# Patient Record
Sex: Female | Born: 1985 | Race: White | Hispanic: No | Marital: Married | Smoking: Never smoker
Health system: Southern US, Community
[De-identification: ages and names within clinical notes are randomized; demographics above are authoritative.]

## PROBLEM LIST (undated history)

## (undated) ENCOUNTER — Inpatient Hospital Stay (HOSPITAL_COMMUNITY): Payer: Self-pay

## (undated) DIAGNOSIS — F419 Anxiety disorder, unspecified: Secondary | ICD-10-CM

## (undated) DIAGNOSIS — Z9889 Other specified postprocedural states: Secondary | ICD-10-CM

## (undated) DIAGNOSIS — R112 Nausea with vomiting, unspecified: Secondary | ICD-10-CM

## (undated) DIAGNOSIS — R9431 Abnormal electrocardiogram [ECG] [EKG]: Secondary | ICD-10-CM

## (undated) DIAGNOSIS — F341 Dysthymic disorder: Secondary | ICD-10-CM

## (undated) HISTORY — DX: Dysthymic disorder: F34.1

## (undated) HISTORY — PX: FRACTURE SURGERY: SHX138

## (undated) HISTORY — PX: WISDOM TOOTH EXTRACTION: SHX21

## (undated) HISTORY — DX: Anxiety disorder, unspecified: F41.9

## (undated) HISTORY — PX: NASAL FRACTURE SURGERY: SHX718

## (undated) HISTORY — DX: Abnormal electrocardiogram (ECG) (EKG): R94.31

---
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
## 2006-09-20 ENCOUNTER — Other Ambulatory Visit: Admission: RE | Admit: 2006-09-20 | Discharge: 2006-09-20 | Payer: Self-pay | Admitting: Family Medicine

## 2007-12-07 ENCOUNTER — Other Ambulatory Visit: Admission: RE | Admit: 2007-12-07 | Discharge: 2007-12-07 | Payer: Self-pay | Admitting: Family Medicine

## 2009-03-25 ENCOUNTER — Other Ambulatory Visit: Admission: RE | Admit: 2009-03-25 | Discharge: 2009-03-25 | Payer: Self-pay | Admitting: Family Medicine

## 2011-02-03 ENCOUNTER — Other Ambulatory Visit (HOSPITAL_COMMUNITY)
Admission: RE | Admit: 2011-02-03 | Discharge: 2011-02-03 | Disposition: A | Discharge status: Home / Self Care | Payer: BC Managed Care – PPO | Source: Ambulatory Visit | Attending: Family Medicine | Admitting: Family Medicine

## 2011-02-03 ENCOUNTER — Other Ambulatory Visit: Payer: Self-pay | Admitting: Family Medicine

## 2011-02-03 DIAGNOSIS — Z113 Encounter for screening for infections with a predominantly sexual mode of transmission: Secondary | ICD-10-CM | POA: Insufficient documentation

## 2011-02-03 DIAGNOSIS — Z124 Encounter for screening for malignant neoplasm of cervix: Secondary | ICD-10-CM | POA: Insufficient documentation

## 2012-02-02 ENCOUNTER — Ambulatory Visit (INDEPENDENT_AMBULATORY_CARE_PROVIDER_SITE_OTHER): Payer: BC Managed Care – PPO | Admitting: Physician Assistant

## 2012-02-02 VITALS — BP 124/73 | HR 66 | Temp 98.5°F | Resp 18 | Ht 60.0 in | Wt 129.0 lb

## 2012-02-02 DIAGNOSIS — J351 Hypertrophy of tonsils: Secondary | ICD-10-CM

## 2012-02-02 DIAGNOSIS — M25532 Pain in left wrist: Secondary | ICD-10-CM

## 2012-02-02 DIAGNOSIS — M25539 Pain in unspecified wrist: Secondary | ICD-10-CM

## 2012-02-02 MED ORDER — MELOXICAM 15 MG PO TABS: 15.0000 mg | ORAL_TABLET | Freq: Every day | ORAL | Status: AC

## 2012-02-02 NOTE — Patient Instructions (Signed)
If you have not heard about your referral appointments in 7 to 10 days please contact us.

## 2012-02-02 NOTE — Progress Notes (Signed)
  Subjective:    Patient ID: Stephanie Gomez, female    DOB: 1986/01/23, 26 y.o.   MRN: [id number]  HPI Ms. [name] is a 26 y/o female who presents with a cc of left wrist pain and right tonsillar swelling.    Left wrist pain began approximately 2 mos ago. No know mechanism of injury. Pain began as intermittent, sharp, shooting pain up the left pinky and ring finger.  It is now a dull, constant ache with intermittent shooting pains that are exacerbated by gripping objects and also  turning the wrist. Using a brace and tylenol arthritis seems to help. 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 She denies any history of injury to the neck and has no neck pain.  She denies tingling or numbness in the hand. She denies weakness.  Right tonsil is chronically swollen.  Patient states that she has a history of intermittent strep pharyngitis/tonsilitis, but denies current sxs. She has mild congestion but denies history of allergies or asthma.   She denies fever, nausea, vomiting, or chills. She states that she feels the tonsil every time she swallows which is bothersome to her.  She feels that she has to eat smaller bites of food and has some dysphagia due to the tonsil.   Review of Systems As stated in HPI    Objective:   Physical Exam  Constitutional: She appears well-developed and well-nourished.  Neck: Normal range of motion.       Right tonsil is enlarged.  No tonsilar or pharyngeal erythema. No exudates.  Cobblestone appearance on posterior pharynx consistent with post nasal drip.  Cardiovascular: Normal rate, regular rhythm and normal heart sounds.   Pulmonary/Chest: Effort normal and breath sounds normal. No respiratory distress.  Musculoskeletal:       Left wrist: She exhibits bony tenderness (over the medial carpals). She exhibits normal range of motion, no tenderness, no swelling, no effusion, no crepitus, no deformity and no laceration.       Pain with finger abduction  and pronation/supination of the wrist. Not tender along ulnar nerve at elbow. No weakness or sensory deficit. Phalens, tinnels, and axial load negative   Filed Vitals:   02/02/12 1616  BP: 124/73  Pulse: 66  Temp: 98.5 F (36.9 C)  Resp: 18          Assessment & Plan:   1. Wrist pain, left  Ambulatory referral to Sports Medicine, meloxicam (MOBIC) 15 MG tablet, continue with wrist brace  2. Swelling of tonsil  Ambulatory referral to ENT   See patient instructions

## 2015-07-07 LAB — OB RESULTS CONSOLE HIV ANTIBODY (ROUTINE TESTING): HIV: NONREACTIVE

## 2015-07-07 LAB — OB RESULTS CONSOLE RUBELLA ANTIBODY, IGM: Rubella: IMMUNE

## 2015-07-07 LAB — OB RESULTS CONSOLE ABO/RH: RH TYPE: POSITIVE

## 2015-07-07 LAB — OB RESULTS CONSOLE GC/CHLAMYDIA
Chlamydia: NEGATIVE
Gonorrhea: NEGATIVE

## 2015-07-07 LAB — OB RESULTS CONSOLE HEPATITIS B SURFACE ANTIGEN: Hepatitis B Surface Ag: NEGATIVE

## 2015-07-07 LAB — OB RESULTS CONSOLE ANTIBODY SCREEN: Antibody Screen: NEGATIVE

## 2015-10-12 NOTE — L&D Delivery Note (Signed)
Delivery Note At 2:59 PM a viable female was delivered via Vaginal, Spontaneous Delivery (Presentation: Left Occiput Anterior).  APGAR: 6, 9; weight  .   Placenta status: Intact, Spontaneous.  Cord: 3 vessels with the following complications: None.  Cord pH: not sent  Anesthesia: Epidural  Episiotomy: None Lacerations: Periurethral Suture Repair: 2.0 chromic Est. Blood Loss (mL): 350  Mom to postpartum.  Baby to Couplet care / Skin to Skin.  Stephanie Gomez A 02/26/2016, 3:16 PM

## 2015-12-15 DIAGNOSIS — F419 Anxiety disorder, unspecified: Secondary | ICD-10-CM | POA: Diagnosis not present

## 2016-02-12 LAB — OB RESULTS CONSOLE GBS: GBS: NEGATIVE

## 2016-02-26 ENCOUNTER — Inpatient Hospital Stay (HOSPITAL_COMMUNITY)
Admission: AD | Admit: 2016-02-26 | Discharge: 2016-02-28 | DRG: 775 | Disposition: A | Discharge status: Home / Self Care | Payer: Managed Care, Other (non HMO) | Source: Ambulatory Visit | Attending: Obstetrics & Gynecology | Admitting: Obstetrics & Gynecology

## 2016-02-26 ENCOUNTER — Inpatient Hospital Stay (HOSPITAL_COMMUNITY): Payer: Managed Care, Other (non HMO) | Admitting: Anesthesiology

## 2016-02-26 ENCOUNTER — Encounter (HOSPITAL_COMMUNITY): Payer: Self-pay

## 2016-02-26 DIAGNOSIS — F419 Anxiety disorder, unspecified: Secondary | ICD-10-CM | POA: Diagnosis not present

## 2016-02-26 DIAGNOSIS — G5603 Carpal tunnel syndrome, bilateral upper limbs: Secondary | ICD-10-CM | POA: Diagnosis present

## 2016-02-26 DIAGNOSIS — Z3A38 38 weeks gestation of pregnancy: Secondary | ICD-10-CM | POA: Diagnosis not present

## 2016-02-26 DIAGNOSIS — O9902 Anemia complicating childbirth: Secondary | ICD-10-CM | POA: Diagnosis present

## 2016-02-26 DIAGNOSIS — O4290 Premature rupture of membranes, unspecified as to length of time between rupture and onset of labor, unspecified weeks of gestation: Secondary | ICD-10-CM | POA: Diagnosis present

## 2016-02-26 DIAGNOSIS — Z6830 Body mass index (BMI) 30.0-30.9, adult: Secondary | ICD-10-CM | POA: Diagnosis not present

## 2016-02-26 DIAGNOSIS — E669 Obesity, unspecified: Secondary | ICD-10-CM | POA: Diagnosis present

## 2016-02-26 DIAGNOSIS — O4202 Full-term premature rupture of membranes, onset of labor within 24 hours of rupture: Principal | ICD-10-CM | POA: Diagnosis present

## 2016-02-26 DIAGNOSIS — Z880 Allergy status to penicillin: Secondary | ICD-10-CM | POA: Diagnosis not present

## 2016-02-26 DIAGNOSIS — O1204 Gestational edema, complicating childbirth: Secondary | ICD-10-CM | POA: Diagnosis present

## 2016-02-26 DIAGNOSIS — E66811 Obesity, class 1: Secondary | ICD-10-CM | POA: Diagnosis present

## 2016-02-26 HISTORY — DX: Nausea with vomiting, unspecified: R11.2

## 2016-02-26 HISTORY — DX: Nausea with vomiting, unspecified: Z98.890

## 2016-02-26 HISTORY — DX: Other specified postprocedural states: Z98.890

## 2016-02-26 LAB — CBC
HCT: 39 % (ref 36.0–46.0)
Hemoglobin: 13.2 g/dL (ref 12.0–15.0)
MCH: 31.4 pg (ref 26.0–34.0)
MCHC: 33.8 g/dL (ref 30.0–36.0)
MCV: 92.9 fL (ref 78.0–100.0)
PLATELETS: 217 10*3/uL (ref 150–400)
RBC: 4.2 MIL/uL (ref 3.87–5.11)
RDW: 14.4 % (ref 11.5–15.5)
WBC: 10.6 10*3/uL — AB (ref 4.0–10.5)

## 2016-02-26 LAB — TYPE AND SCREEN
ABO/RH(D): O POS
ANTIBODY SCREEN: NEGATIVE

## 2016-02-26 LAB — ABO/RH: ABO/RH(D): O POS

## 2016-02-26 MED ORDER — ACETAMINOPHEN 325 MG PO TABS: 650.0000 mg | ORAL_TABLET | ORAL | Status: DC | PRN

## 2016-02-26 MED ORDER — OXYCODONE-ACETAMINOPHEN 5-325 MG PO TABS: 2.0000 | ORAL_TABLET | ORAL | Status: DC | PRN

## 2016-02-26 MED ORDER — LIDOCAINE HCL (PF) 1 % IJ SOLN
INTRAMUSCULAR | Status: DC | PRN
  Administered 2016-02-26 (×2): 4 mL

## 2016-02-26 MED ORDER — DIPHENHYDRAMINE HCL 25 MG PO CAPS: 25.0000 mg | ORAL_CAPSULE | Freq: Four times a day (QID) | ORAL | Status: DC | PRN

## 2016-02-26 MED ORDER — EPHEDRINE 5 MG/ML INJ: 10.0000 mg | INTRAVENOUS | Status: DC | PRN

## 2016-02-26 MED ORDER — LIDOCAINE HCL (PF) 1 % IJ SOLN
30.0000 mL | INTRAMUSCULAR | Status: AC | PRN
  Administered 2016-02-26: 30 mL via SUBCUTANEOUS
  Filled 2016-02-26: qty 30

## 2016-02-26 MED ORDER — OXYTOCIN BOLUS FROM INFUSION
500.0000 mL | INTRAVENOUS | Status: DC
  Administered 2016-02-26: 500 mL via INTRAVENOUS

## 2016-02-26 MED ORDER — PRENATAL MULTIVITAMIN CH
1.0000 | ORAL_TABLET | Freq: Every day | ORAL | Status: DC
  Administered 2016-02-27 – 2016-02-28 (×2): 1 via ORAL
  Filled 2016-02-26 (×2): qty 1

## 2016-02-26 MED ORDER — BENZOCAINE-MENTHOL 20-0.5 % EX AERO
1.0000 "application " | INHALATION_SPRAY | CUTANEOUS | Status: DC | PRN
  Filled 2016-02-26: qty 56

## 2016-02-26 MED ORDER — SENNOSIDES-DOCUSATE SODIUM 8.6-50 MG PO TABS
2.0000 | ORAL_TABLET | ORAL | Status: DC
  Administered 2016-02-26 – 2016-02-27 (×2): 2 via ORAL
  Filled 2016-02-26 (×2): qty 2

## 2016-02-26 MED ORDER — ZOLPIDEM TARTRATE 5 MG PO TABS: 5.0000 mg | ORAL_TABLET | Freq: Every evening | ORAL | Status: DC | PRN

## 2016-02-26 MED ORDER — NALBUPHINE HCL 10 MG/ML IJ SOLN: 5.0000 mg | INTRAMUSCULAR | Status: DC | PRN

## 2016-02-26 MED ORDER — ONDANSETRON HCL 4 MG/2ML IJ SOLN: 4.0000 mg | INTRAMUSCULAR | Status: DC | PRN

## 2016-02-26 MED ORDER — DIPHENHYDRAMINE HCL 50 MG/ML IJ SOLN: 12.5000 mg | INTRAMUSCULAR | Status: DC | PRN

## 2016-02-26 MED ORDER — OXYCODONE-ACETAMINOPHEN 5-325 MG PO TABS: 1.0000 | ORAL_TABLET | ORAL | Status: DC | PRN

## 2016-02-26 MED ORDER — DIBUCAINE 1 % RE OINT: 1.0000 "application " | TOPICAL_OINTMENT | RECTAL | Status: DC | PRN

## 2016-02-26 MED ORDER — LACTATED RINGERS IV SOLN
INTRAVENOUS | Status: DC
  Administered 2016-02-26 (×2): via INTRAVENOUS

## 2016-02-26 MED ORDER — FENTANYL 2.5 MCG/ML BUPIVACAINE 1/10 % EPIDURAL INFUSION (WH - ANES)
14.0000 mL/h | INTRAMUSCULAR | Status: DC | PRN
  Administered 2016-02-26: 12 mL/h via EPIDURAL
  Filled 2016-02-26: qty 125

## 2016-02-26 MED ORDER — CITRIC ACID-SODIUM CITRATE 334-500 MG/5ML PO SOLN: 30.0000 mL | ORAL | Status: DC | PRN

## 2016-02-26 MED ORDER — MEASLES, MUMPS & RUBELLA VAC ~~LOC~~ INJ: 0.5000 mL | INJECTION | Freq: Once | SUBCUTANEOUS | Status: DC

## 2016-02-26 MED ORDER — OXYTOCIN 40 UNITS IN LACTATED RINGERS INFUSION - SIMPLE MED
2.5000 [IU]/h | INTRAVENOUS | Status: DC
  Filled 2016-02-26: qty 1000

## 2016-02-26 MED ORDER — COCONUT OIL OIL: 1.0000 "application " | TOPICAL_OIL | Status: DC | PRN

## 2016-02-26 MED ORDER — ONDANSETRON HCL 4 MG PO TABS: 4.0000 mg | ORAL_TABLET | ORAL | Status: DC | PRN

## 2016-02-26 MED ORDER — WITCH HAZEL-GLYCERIN EX PADS: 1.0000 "application " | MEDICATED_PAD | CUTANEOUS | Status: DC | PRN

## 2016-02-26 MED ORDER — FLEET ENEMA 7-19 GM/118ML RE ENEM: 1.0000 | ENEMA | RECTAL | Status: DC | PRN

## 2016-02-26 MED ORDER — TETANUS-DIPHTH-ACELL PERTUSSIS 5-2.5-18.5 LF-MCG/0.5 IM SUSP: 0.5000 mL | Freq: Once | INTRAMUSCULAR | Status: DC

## 2016-02-26 MED ORDER — PHENYLEPHRINE 40 MCG/ML (10ML) SYRINGE FOR IV PUSH (FOR BLOOD PRESSURE SUPPORT): 80.0000 ug | PREFILLED_SYRINGE | INTRAVENOUS | Status: DC | PRN

## 2016-02-26 MED ORDER — SIMETHICONE 80 MG PO CHEW: 80.0000 mg | CHEWABLE_TABLET | ORAL | Status: DC | PRN

## 2016-02-26 MED ORDER — LACTATED RINGERS IV SOLN: 500.0000 mL | INTRAVENOUS | Status: DC | PRN

## 2016-02-26 MED ORDER — ONDANSETRON HCL 4 MG/2ML IJ SOLN: 4.0000 mg | Freq: Four times a day (QID) | INTRAMUSCULAR | Status: DC | PRN

## 2016-02-26 MED ORDER — IBUPROFEN 600 MG PO TABS
600.0000 mg | ORAL_TABLET | Freq: Four times a day (QID) | ORAL | Status: DC
  Administered 2016-02-26 – 2016-02-28 (×8): 600 mg via ORAL
  Filled 2016-02-26 (×8): qty 1

## 2016-02-26 MED ORDER — LACTATED RINGERS IV SOLN
500.0000 mL | Freq: Once | INTRAVENOUS | Status: AC
  Administered 2016-02-26: 500 mL via INTRAVENOUS

## 2016-02-26 MED ORDER — PHENYLEPHRINE 40 MCG/ML (10ML) SYRINGE FOR IV PUSH (FOR BLOOD PRESSURE SUPPORT)
80.0000 ug | PREFILLED_SYRINGE | INTRAVENOUS | Status: DC | PRN
  Filled 2016-02-26: qty 10

## 2016-02-26 NOTE — Lactation Note (Signed)
This note was copied from a baby's chart. Lactation Consultation Note  Patient Name: Stephanie Gomez LKGMW'NToday's Date: 02/26/2016 Reason for consult: Initial assessment Baby at 6 hr of life. Mom has short shaft nipples with compressible breast, but her hands and feet are "very swollen". Baby had tongue curled up and might need an oral assessment if latch does not improve. Baby will open wide and as long as mom holds the breast baby will stay on for 10-12 sucks. Without support baby takes 2-6 sucks and slips off the nipple. Discussed baby behavior, feeding frequency, baby belly size, voids, wt loss, breast changes, and nipple care. Demonstrated manual expression, colostrum noted bilaterally, spoon in room. Given lactation handouts. Aware of OP services and support group.     Maternal Data Has patient been taught Hand Expression?: Yes Does the patient have breastfeeding experience prior to this delivery?: No  Feeding Feeding Type: Breast Fed  LATCH Score/Interventions Latch: Repeated attempts needed to sustain latch, nipple held in mouth throughout feeding, stimulation needed to elicit sucking reflex. Intervention(s): Adjust position;Assist with latch;Breast compression  Audible Swallowing: None Intervention(s): Skin to skin;Hand expression  Type of Nipple: Everted at rest and after stimulation  Comfort (Breast/Nipple): Soft / non-tender     Hold (Positioning): Full assist, staff holds infant at breast Intervention(s): Support Pillows;Position options  LATCH Score: 5  Lactation Tools Discussed/Used WIC Program: No   Consult Status Consult Status: Follow-up Date: 02/27/16 Follow-up type: In-patient    Stephanie Gomez 02/26/2016, 9:36 PM

## 2016-02-26 NOTE — Anesthesia Procedure Notes (Signed)
Epidural Patient location during procedure: OB  Staffing Anesthesiologist: Tonna Palazzi Performed by: anesthesiologist   Preanesthetic Checklist Completed: patient identified, site marked, surgical consent, pre-op evaluation, timeout performed, IV checked, risks and benefits discussed and monitors and equipment checked  Epidural Patient position: sitting Prep: site prepped and draped and DuraPrep Patient monitoring: continuous pulse ox and blood pressure Approach: midline Location: L3-L4 Injection technique: LOR saline  Needle:  Needle type: Tuohy  Needle gauge: 17 G Needle length: 9 cm and 9 Needle insertion depth: 6 cm Catheter type: closed end flexible Catheter size: 19 Gauge Catheter at skin depth: 10 cm Test dose: negative  Assessment Events: blood not aspirated, injection not painful, no injection resistance, negative IV test and no paresthesia  Additional Notes Patient identified. Risks/Benefits/Options discussed with patient including but not limited to bleeding, infection, nerve damage, paralysis, failed block, incomplete pain control, headache, blood pressure changes, nausea, vomiting, reactions to medication both or allergic, itching and postpartum back pain. Confirmed with bedside nurse the patient's most recent platelet count. Confirmed with patient that they are not currently taking any anticoagulation, have any bleeding history or any family history of bleeding disorders. Patient expressed understanding and wished to proceed. All questions were answered. Sterile technique was used throughout the entire procedure. Please see nursing notes for vital signs. Test dose was given through epidural catheter and negative prior to continuing to dose epidural or start infusion. Warning signs of high block given to the patient including shortness of breath, tingling/numbness in hands, complete motor block, or any concerning symptoms with instructions to call for help. Patient was  given instructions on fall risk and not to get out of bed. All questions and concerns addressed with instructions to call with any issues or inadequate analgesia.    

## 2016-02-26 NOTE — Anesthesia Pain Management Evaluation Note (Signed)
  CRNA Pain Management Visit Note  Patient: Stephanie PertRebecca N Esperanza, 30 y.o., female  "Hello I am a [facility] the anesthesia [facility]. We have an anesthesia team available at all times to provide care throughout the hospital, including epidural management and anesthesia for C-section. I don't know your plan for the delivery whether it a natural birth, water birth, IV sedation, nitrous supplementation, doula or epidural, but we want to meet your pain goals."   1.Was your pain managed to your expectations on prior hospitalizations?   No prior hospitalizations  2.What is your expectation for pain management during this hospitalization?     Epidural  3.How can we help you reach that goal? **epidural pending  Record the patient's initial score and the patient's pain goal.   Pain: 9  Pain Goal: 8 [facility] wants you to be able to say your pain was always managed very well.  Edison PaceWILKERSON,Kaycee Haycraft 02/26/2016

## 2016-02-26 NOTE — Progress Notes (Signed)
Patient ID: Stephanie PertRebecca N Crago, female   DOB: May 26, 1986, 30 y.o.   MRN: [id number] Pt comfortable with epidural BP 110/66 mmHg  Pulse 118  Temp(Src) 98.4 F (36.9 C) (Oral)  Resp 18  Ht 4' 11.5" (1.511 m)  Wt 172 lb (78.019 kg)  BMI 34.17 kg/m2  SpO2 98%  LMP 05/31/2015 Category 1 9/100/0 Continue to labor Anticipate SVD

## 2016-02-26 NOTE — Anesthesia Preprocedure Evaluation (Signed)
Anesthesia Evaluation  Patient identified by MRN, date of birth, ID band Patient awake    Reviewed: Allergy & Precautions, NPO status , Patient's Chart, lab work & pertinent test results  History of Anesthesia Complications (+) PONV and history of anesthetic complications  Airway Mallampati: II  TM Distance: >3 FB Neck ROM: Full    Dental no notable dental hx. (+) Dental Advisory Given   Pulmonary neg pulmonary ROS,    Pulmonary exam normal breath sounds clear to auscultation       Cardiovascular negative cardio ROS Normal cardiovascular exam Rhythm:Regular Rate:Normal     Neuro/Psych PSYCHIATRIC DISORDERS Anxiety negative neurological ROS     GI/Hepatic negative GI ROS, Neg liver ROS,   Endo/Other  obesity  Renal/GU negative Renal ROS  negative genitourinary   Musculoskeletal negative musculoskeletal ROS (+)   Abdominal   Peds negative pediatric ROS (+)  Hematology negative hematology ROS (+)   Anesthesia Other Findings   Reproductive/Obstetrics (+) Pregnancy                             Anesthesia Physical Anesthesia Plan  ASA: II  Anesthesia Plan: Epidural   Post-op Pain Management:    Induction:   Airway Management Planned:   Additional Equipment:   Intra-op Plan:   Post-operative Plan:   Informed Consent: I have reviewed the patients History and Physical, chart, labs and discussed the procedure including the risks, benefits and alternatives for the proposed anesthesia with the patient or authorized representative who has indicated his/her understanding and acceptance.   Dental advisory given  Plan Discussed with: CRNA  Anesthesia Plan Comments:         Anesthesia Quick Evaluation

## 2016-02-26 NOTE — MAU Note (Signed)
Patient presents with ROM at 2300. Patient denies any bleeding. Fetus active.

## 2016-02-26 NOTE — H&P (Signed)
Stephanie Gomez is a 30 y.o. female, G1P0000 at 38.5 weeks, presenting for Rupture of membranes at 2315 on 02/25/16  with contractions increasing in frequency and intensity. +FM, no vaginal bleeding  Patient Active Problem List   Diagnosis Date Noted  . Obesity (BMI 30.0-34.9) 02/26/2016    History of present pregnancy: Patient entered care at 5.2 weeks.   EDC of 03/06/16 was established by LMP on 05/31/15.    Anatomy scan:  21.2 weeks on 10/21/15, with normal findings and an posterior placenta.    Singleton pregnancy, breech presentation,  Cervix closed- measured transabdominally 4.0 cm, posterior  placenta- placenta edge 3.1cm from internal os, fluid is normal, CP, thal, NB, profile, orbits, palate, max, mand,  SVC/IVC, 3VTV, 3VV, diaphragm, renal arteries, open hand seen. EFW 408g= 58th%, All anatomy seen and  wnl, anatomy complete. 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 PONV (postoperative nausea and vomiting)    History reviewed. No pertinent past surgical history. Family History: family history is not on file. Social History:  reports that she has never smoked. She does not have any smokeless tobacco history on file. Her alcohol and drug histories are not on file.   Caucasian, Attended 2 years of college and works as a Museum/gallery conservatorVet tech. Identifies as catholic,  is married, and Building surveyorhetersexual   Prenatal Transfer Tool  Maternal Diabetes: No Genetic Screening: Declined Maternal Ultrasounds/Referrals: Normal Fetal Ultrasounds or other Referrals:  None Maternal Substance Abuse:  No Significant Maternal Medications:  None Significant Maternal Lab Results: Lab values include: Group B Strep negative  TDAP 01/15/16 Flu 08/05/2015  ROS:  All 10 systems reviewed and wnl except were indicated in HPI  Allergies  Allergen Reactions  . Penicillins    Dilation: 2 Effacement (%): 80 Station: -1 Exam by:: Alphonzo Severanceachel Thimothy Barretta CNM  Blood pressure 125/85, pulse 79, temperature 98.3 F (36.8 C), temperature source Oral, resp. rate 18, last menstrual period 05/31/2015.  Chest clear Heart RRR without murmur Abd gravid, NT, FH 38 cm Pelvic: adequate Ext:  +2 pedal edema  FHR: Category 1, 130 bpm , moderate variability, +accels, no decels UCs: regular, every 4 min  Rupture of membranes at 2315  Dilation: 2 Effacement (%): 80 Station: -1 Presentation: Vertex Exam by:: Alphonzo Severanceachel Rad Gramling CNM   Prenatal labs: ABO, Rh:     O positive Antibody:     Negative  Rubella:  !Error!     Immune  RPR:     NR 07/07/15, 12/18/15 HBsAg:    NR  07/07/15 HIV:    NR    07/07/15 GBS:    negative Sickle cell/Hgb electrophoresis:  N/A Pap:  wnl 11/04/14 GC:  Negative  07/07/15 Chlamydia:  Negative   07/07/15 Genetic screenings:  declined Glucola:  wnl Other:   Toxoplasmosis  antibodies negative, 08/05/15 Hgb 12.7 at NOB, 12.5 at 28 weeks    Assessment/Plan: IUP at 38.5 wks SROM at 2315 on 02/25/16 BMI 34.6  GBS negative Cat 1 FT   Plan: Admit to [employer] Routine [facility] orders Nitrous Oxide/Pain med/epidural prn Expectant management Anticipate SVD  Beatrix Fetters, [place] 02/26/2016, 2:09 AM

## 2016-02-26 NOTE — MAU Note (Signed)
Notified provider that patient is here and is ruptured.

## 2016-02-26 NOTE — Progress Notes (Signed)
Subjective: Uncomfortable with contractions. Utilizing Nitrous oxide with some relief, may consider an epidural.  Husband at bedside for support.   Objective: BP 110/62 mmHg  Pulse 66  Temp(Src) 98.4 F (36.9 C) (Oral)  Resp 18  Ht 4' 11.5" (1.511 m)  Wt 78.019 kg (172 lb)  BMI 34.17 kg/m2  LMP 05/31/2015      FHT: Category 1,  135 bpm, moderate variability, +accels, no decels UC:   regular, every 2-3 minutes SVE:   Dilation: 6 Effacement (%): 90 Station: 0 Exam by:: Fleet Contrasachel Jazmeen Axtell,CNM Membranes: SROM at 2315 Induction/Augmentation: Cytotec - none Pitocin- none INternal monitors: none  Pain management: Nitrous Oxide, Breathing, maternal coping, movement  Assessment:  IUP at 38.5 wks Active labor SROM x 8 hrs GBS negative Cat 1 FT  Plan: Intermittent fetal monitoring if Cat 1 and no pain meds May saline lock IV Encourage frequent movement/position changes to facilitate fetal descent and rotation  Nitrous Oxide/Pain med/epidural prn Expectant management Report to oncoming provider N. Normand Sloopillard, MD   Alphonzo Severanceachel Makenley Shimp CNM, [place] 02/26/2016, 7:01 AM

## 2016-02-26 NOTE — Progress Notes (Signed)
Subjective: Breathing through contractions. Desires non medicated birth but is open to nitrous Oxide for pain management.  Husband present for support.  Birth plan present in Room.  Objective: BP 115/82 mmHg  Pulse 96  Temp(Src) 98.3 F (36.8 C) (Oral)  Resp 18  Ht 4' 11.5" (1.511 m)  Wt 78.019 kg (172 lb)  BMI 34.17 kg/m2  LMP 05/31/2015      FHT: Category 1, 135 bpm,moderte variability, +accels, no decels UC:   regular, every 2-4 minutes, palpate mild SVE:   Dilation: 4 Effacement (%): 90 Station: -1 Exam by:: [name],CNM Membranes: SROM at 2315 Induction/Augmentation: Cytotec - none Pitocin- none INternal monitors: none  Pain management:  Breathing, maternal coping, movement   Assessment:  IUP at 38.5 wks Early labor SROM x 6 hrs GBS negative Cat 1 FT  Plan: Intermittent fetal monitoring if Cat 1 and no pain meds May saline lock IV Encourage frequent movement/position changes to facilitate fetal descent and rotation  Nitrous Oxide/Pain med/epidural prn Expectant management Report to oncoming provider N. Normand Sloopillard, MD   Stephanie Severanceachel Hermie Gomez CNM, [place] 02/26/2016, 5:07 AM

## 2016-02-27 LAB — CBC
HCT: 35.3 % — ABNORMAL LOW (ref 36.0–46.0)
Hemoglobin: 11.8 g/dL — ABNORMAL LOW (ref 12.0–15.0)
MCH: 30.7 pg (ref 26.0–34.0)
MCHC: 33.4 g/dL (ref 30.0–36.0)
MCV: 91.9 fL (ref 78.0–100.0)
PLATELETS: 195 10*3/uL (ref 150–400)
RBC: 3.84 MIL/uL — AB (ref 3.87–5.11)
RDW: 14.8 % (ref 11.5–15.5)
WBC: 16 10*3/uL — AB (ref 4.0–10.5)

## 2016-02-27 LAB — RPR: RPR: NONREACTIVE

## 2016-02-27 NOTE — Progress Notes (Signed)
Post Partum Day 1 Subjective:  Well. Lochia are normal. Voiding, ambulating, tolerating normal diet. nursing going well.  Objective: Blood pressure 120/72, pulse 96, temperature 98.6 F (37 C), temperature source Oral, resp. rate 20, height 4' 11.5" (1.511 m), weight 172 lb (78.019 kg), last menstrual period 05/31/2015, SpO2 98 %, unknown if currently breastfeeding.  Physical Exam:  General: normal Lochia: appropriate Uterine Fundus: o/2 firm non-tender  Extremities: No evidence of DVT seen on physical exam. Edema 1+     Recent Labs  02/26/16 0140 02/27/16 0527  HGB 13.2 11.8*  HCT 39.0 35.3*    Assessment/Plan: Normal Post-partum. Continue routine post-partum care. Anticipate discharge tomorrow    LOS: 1 day   Karel Mowers A MD 02/27/2016, 9:42 AM

## 2016-02-27 NOTE — Progress Notes (Signed)
CSW consult acknowledged for this mother with history of anxiety.  CSW introduced self to mother and father and explained role of CSW. Parents receptive to visit.  Mother states that she has a history of anxiety of 2- 3 years and that her anxiety became much worse during pregnancy. Mother states she has never been on medication for anxiety and while worrisome, has never been to the point of affecting functioning.  Mother states that she has been "surprisingly calm through labor and delivery."  Mother states she now feels much relief that baby here and healthy.  Mother has good network of natural supports.   CSW dicussed risks for, signs, and symptoms of post partem depression.  Mother and father acknowledged understanding.  No further needs expressed. Gerrie NordmannMichelle Barrett-Hilton, LCSW [phone]

## 2016-02-27 NOTE — Anesthesia Postprocedure Evaluation (Signed)
Anesthesia Post Note  Patient: Clayburn PertRebecca N Whidby  Procedure(s) Performed: * No procedures listed *  Patient location during evaluation: Mother Baby Anesthesia Type: Epidural Level of consciousness: awake and alert and oriented Pain management: pain level controlled Vital Signs Assessment: post-procedure vital signs reviewed and stable Respiratory status: spontaneous breathing and nonlabored ventilation Cardiovascular status: stable Postop Assessment: epidural receding, patient able to bend at knees, no signs of nausea or vomiting and adequate PO intake Anesthetic complications: no     Last Vitals:  Filed Vitals:   02/26/16 2156 02/27/16 0600  BP: 110/55 120/72  Pulse: 100 96  Temp: 37.1 C 37 C  Resp: 20 20    Last Pain:  Filed Vitals:   02/27/16 0604  PainSc: 4    Pain Goal: Patients Stated Pain Goal: 0 (02/26/16 0117)               Donnalee CurryMalinova,Jessa Stinson Hristova

## 2016-02-28 MED ORDER — IBUPROFEN 800 MG PO TABS: 800.0000 mg | ORAL_TABLET | Freq: Three times a day (TID) | ORAL | Status: DC | PRN

## 2016-02-28 NOTE — Lactation Note (Signed)
This note was copied from a baby's chart. Lactation Consultation Note  Patient Name: Stephanie Perrin MalteseRebecca Deloach WUJWJ'XToday's Date: 02/28/2016 Reason for consult: Follow-up assessment;Infant weight loss;Other (Comment) (7% weight loss, ) Baby is 8844 hours old and has been consistent at the breast.  [facility] lat[facility] in football position.  LC at 1st noted the baby  to be non - nutritive , LC shifted the baby's hips and noted the depth to improve.  Multiply swallows noted, increased with breast compressions even more .  Baby fed 10 more minutes and released on her own, nipple well rounded.  Mom has carpal Tunnel - and per mom fingers have felt numb during pregnancy and post partum.  LC gave mom suggestions for positioning at the breast , especially football so the pillows would help  Support  The baby and mom wouldn't have to use hands.  Sore nipple and engorgement prevention and tx reviewed.  LC instructed mom on the use hand pump , shells due to swelling , and per mom has a DEBP at home.  Mother informed of post-discharge support and given phone number to the lactation department, including  services for phone call assistance; out-patient appointments; and breastfeeding support group. List of other  breastfeeding resources in the community given in the handout. Encouraged mother to call for problems or  concerns related to breastfeeding.   Maternal Data    Feeding Feeding Type:  (baby already latched, - re - positioned, multiply swallows ) Length of feed: 45 min  LATCH Score/Interventions Latch:  (latched with depth ) Intervention(s): Adjust position;Assist with latch  Audible Swallowing:  (multiply swallows , increased with breast compressions ) Intervention(s): Hand expression;Skin to skin  Type of Nipple:  (nipple well rounded when baby released )  Comfort (Breast/Nipple):  (mom more comfortable when LC shifted baby's hips )  Problem noted: Mild/Moderate  discomfort  Hold (Positioning): Assistance needed to correctly position infant at breast and maintain latch. Intervention(s): Breastfeeding basics reviewed;Support Pillows;Position options;Skin to skin  LATCH Score: 7  Lactation Tools Discussed/Used Tools: Shells;Pump Shell Type: Inverted Breast pump type: Manual Pump Review: Setup, frequency, and cleaning Initiated by:: [name]  Date initiated:: 02/28/16   Consult Status Consult Status: Complete Date: 02/28/16    Kathrin Greathouseorio, Maecie Sevcik Ann 02/28/2016, 12:07 PM

## 2016-02-28 NOTE — Discharge Summary (Signed)
[facility] Discharge Summary   Patient Name:   Stephanie Gomez DOB:     10/16/1985 MRN:     [id number]  Date of Admission:   02/26/2016 Date of Discharge:  02/28/2016  Admitting diagnosis:    38 WEEKS ROM Principal Problem:   Vaginal delivery Active Problems:   Obesity (BMI 30.0-34.9)   Anxiety   Allergy to penicillin   Rupture of membranes with delay of delivery  Discharge diagnosis:    38 WEEKS ROM Principal Problem:   Vaginal delivery Active Problems:   Obesity (BMI 30.0-34.9)   Anxiety   Allergy to penicillin   Rupture of membranes with delay of delivery Anemia                                                                     Post partum procedures: None  Type of Delivery:  Vaginal delivery  Delivering Provider: Jaymes Graff   Date of Delivery:  02/26/2016  Newborn Data:    Live born female  Birth Weight: 7 lb 12.9 oz (3540 g) APGAR: 6, 9  Baby's Name:  Jamesetta Orleans Feeding:   Breast Disposition:   home with mother  Complications:   None  Hospital course:      Onset of Labor With Vaginal Delivery     30 y.o. yo G1P1001 at [redacted]w[redacted]d was admitted in Active Labor on 02/26/2016. Patient had an uncomplicated labor course as follows:  Membrane Rupture Time/Date: 11:00 PM ,02/26/2016   Intrapartum Procedures: Episiotomy: None [1]                                         Lacerations:  Periurethral [8]  Patient had a delivery of a Viable infant. 02/26/2016  Information for the patient's newborn:  Raghad, Lorenz Girl [place] [[id number]]  Delivery Method: Vaginal, Spontaneous Delivery (Filed from Delivery Summary)    Pateint had an uncomplicated postpartum course.  She is ambulating, tolerating a regular diet, passing flatus, and urinating well. Patient is discharged home in stable condition on 02/28/2016.    Physical Exam:   Filed Vitals:   02/26/16 1800 02/26/16 2156 02/27/16 0600 02/27/16 1822  BP: 117/77 110/55 120/72 123/81  Pulse: 98 100 96  100  Temp: 98.1 F (36.7 C) 98.8 F (37.1 C) 98.6 F (37 C) 98 F (36.7 C)  TempSrc: Oral Oral Oral Oral  Resp: Height:      Weight:      SpO2:       General: no distress Lochia: appropriate Uterine Fundus: firm Incision: N/A DVT Evaluation: No evidence of DVT seen on physical exam.  Labs: Lab Results  Component Value Date   WBC 16.0* 02/27/2016   HGB 11.8* 02/27/2016   HCT 35.3* 02/27/2016   MCV 91.9 02/27/2016   PLT 195 02/27/2016   No flowsheet data found.  Discharge instruction: per After Visit Summary and "Baby and Me Booklet".  After Visit Meds:    Medication List    TAKE these medications        ibuprofen 800 MG tablet  Commonly known as:  ADVIL,MOTRIN  Take 1 tablet (800  mg total) by mouth every 8 (eight) hours as needed.     prenatal multivitamin Tabs tablet  Take 1 tablet by mouth daily at 12 noon.        Diet: routine diet  Activity: Advance as tolerated. Pelvic rest for 6 weeks.   Outpatient follow up:6 weeks Follow up Appt:No future appointments. Follow up visit: No Follow-up on file.  Postpartum contraception: Undecided  02/28/2016 Janine LimboSTRINGER,Kamani Lewter V, MD

## 2016-02-28 NOTE — Discharge Instructions (Signed)
Postpartum Depression and Baby Blues °The postpartum period begins right after the birth of a baby. During this time, there is often a great amount of joy and excitement. It is also a time of many changes in the life of the parents. Regardless of how many times a mother gives birth, each child brings new challenges and dynamics to the family. It is not unusual to have feelings of excitement along with confusing shifts in moods, emotions, and thoughts. All mothers are at risk of developing postpartum depression or the "baby blues." These mood changes can occur right after giving birth, or they may occur many months after giving birth. The baby blues or postpartum depression can be mild or severe. Additionally, postpartum depression can go away rather quickly, or it can be a long-term condition.  °CAUSES °Raised hormone levels and the rapid drop in those levels are thought to be a main cause of postpartum depression and the baby blues. A number of hormones change during and after pregnancy. Estrogen and progesterone usually decrease right after the delivery of your baby. The levels of thyroid hormone and various cortisol steroids also rapidly drop. Other factors that play a role in these mood changes include major life events and genetics.  °RISK FACTORS °If you have any of the following risks for the baby blues or postpartum depression, know what symptoms to watch out for during the postpartum period. Risk factors that may increase the likelihood of getting the baby blues or postpartum depression include: °· Having a personal or family history of depression.   °· Having depression while being pregnant.   °· Having premenstrual mood issues or mood issues related to oral contraceptives. °· Having a lot of life stress.   °· Having marital conflict.   °· Lacking a social support network.   °· Having a baby with special needs.   °· Having health problems, such as diabetes.   °SIGNS AND SYMPTOMS °Symptoms of baby blues  include: °· Brief changes in mood, such as going from extreme happiness to sadness. °· Decreased concentration.   °· Difficulty sleeping.   °· Crying spells, tearfulness.   °· Irritability.   °· Anxiety.   °Symptoms of postpartum depression typically begin within the first month after giving birth. These symptoms include: °· Difficulty sleeping or excessive sleepiness.   °· Marked weight loss.   °· Agitation.   °· Feelings of worthlessness.   °· Lack of interest in activity or food.   °Postpartum psychosis is a very serious condition and can be dangerous. Fortunately, it is rare. Displaying any of the following symptoms is cause for immediate medical attention. Symptoms of postpartum psychosis include:  °· Hallucinations and delusions.   °· Bizarre or disorganized behavior.   °· Confusion or disorientation.   °DIAGNOSIS  °A diagnosis is made by an evaluation of your symptoms. There are no medical or lab tests that lead to a diagnosis, but there are various questionnaires that a health care provider may use to identify those with the baby blues, postpartum depression, or psychosis. Often, a screening tool called the Edinburgh Postnatal Depression Scale is used to diagnose depression in the postpartum period.  °TREATMENT °The baby blues usually goes away on its own in 1-2 weeks. Social support is often all that is needed. You will be encouraged to get adequate sleep and rest. Occasionally, you may be given medicines to help you sleep.  °Postpartum depression requires treatment because it can last several months or longer if it is not treated. Treatment may include individual or group therapy, medicine, or both to address any social, physiological, and psychological   factors that may play a role in the depression. Regular exercise, a healthy diet, rest, and social support may also be strongly recommended.  °Postpartum psychosis is more serious and needs treatment right away. Hospitalization is often needed. °HOME CARE  INSTRUCTIONS °· Get as much rest as you can. Nap when the baby sleeps.   °· Exercise regularly. Some women find yoga and walking to be beneficial.   °· Eat a balanced and nourishing diet.   °· Do little things that you enjoy. Have a cup of tea, take a bubble bath, read your favorite magazine, or listen to your favorite music. °· Avoid alcohol.   °· Ask for help with household chores, cooking, grocery shopping, or running errands as needed. Do not try to do everything.   °· Talk to people close to you about how you are feeling. Get support from your partner, family members, friends, or other new moms. °· Try to stay positive in how you think. Think about the things you are grateful for.   °· Do not spend a lot of time alone.   °· Only take over-the-counter or prescription medicine as directed by your health care provider. °· Keep all your postpartum appointments.   °· Let your health care provider know if you have any concerns.   °SEEK MEDICAL CARE IF: °You are having a reaction to or problems with your medicine. °SEEK IMMEDIATE MEDICAL CARE IF: °· You have suicidal feelings.   °· You think you may harm the baby or someone else. °MAKE SURE YOU: °· Understand these instructions. °· Will watch your condition. °· Will get help right away if you are not doing well or get worse. °  °This information is not intended to replace advice given to you by your health care provider. Make sure you discuss any questions you have with your health care provider. °  °Document Released: 07/01/2004 Document Revised: 10/02/2013 Document Reviewed: 07/09/2013 °Elsevier Interactive Patient Education ©2016 [employer] °Postpartum Care After Vaginal Delivery °After you deliver your newborn (postpartum period), the usual stay in the hospital is 24-72 hours. If there were problems with your labor or delivery, or if you have other medical problems, you might be in the hospital longer.  °While you are in the hospital, you will receive help and  instructions on how to care for yourself and your newborn during the postpartum period.  °While you are in the hospital: °· Be sure to tell your nurses if you have pain or discomfort, as well as where you feel the pain and what makes the pain worse. °· If you had an incision made near your vagina (episiotomy) or if you had some tearing during delivery, the nurses may put ice packs on your episiotomy or tear. The ice packs may help to reduce the pain and swelling. °· If you are breastfeeding, you may feel uncomfortable contractions of your uterus for a couple of weeks. This is normal. The contractions help your uterus get back to normal size. °· It is normal to have some bleeding after delivery. °· For the first 1-3 days after delivery, the flow is red and the amount may be similar to a period. °· It is common for the flow to start and stop. °· In the first few days, you may pass some small clots. Let your nurses know if you begin to pass large clots or your flow increases. °· Do not  flush blood clots down the toilet before having the nurse look at them. °· During the next 3-10 days after delivery, your   flow should become more watery and pink or brown-tinged in color. °¨ Ten to fourteen days after delivery, your flow should be a small amount of yellowish-white discharge. °¨ The amount of your flow will decrease over the first few weeks after delivery. Your flow may stop in 6-8 weeks. Most women have had their flow stop by 12 weeks after delivery. °· You should change your sanitary pads frequently. °· Wash your hands thoroughly with soap and water for at least 20 seconds after changing pads, using the toilet, or before holding or feeding your newborn. °· You should feel like you need to empty your bladder within the first 6-8 hours after delivery. °· In case you become weak, lightheaded, or faint, call your nurse before you get out of bed for the first time and before you take a shower for the first time. °· Within  the first few days after delivery, your breasts may begin to feel tender and full. This is called engorgement. Breast tenderness usually goes away within 48-72 hours after engorgement occurs. You may also notice milk leaking from your breasts. If you are not breastfeeding, do not stimulate your breasts. Breast stimulation can make your breasts produce more milk. °· Spending as much time as possible with your newborn is very important. During this time, you and your newborn can feel close and get to know each other. Having your newborn stay in your room (rooming in) will help to strengthen the bond with your newborn.  It will give you time to get to know your newborn and become comfortable caring for your newborn. °· Your hormones change after delivery. Sometimes the hormone changes can temporarily cause you to feel sad or tearful. These feelings should not last more than a few days. If these feelings last longer than that, you should talk to your caregiver. °· If desired, talk to your caregiver about methods of family planning or contraception. °· Talk to your caregiver about immunizations. Your caregiver may want you to have the following immunizations before leaving the hospital: °¨ Tetanus, diphtheria, and pertussis (Tdap) or tetanus and diphtheria (Td) immunization. It is very important that you and your family (including grandparents) or others caring for your newborn are up-to-date with the Tdap or Td immunizations. The Tdap or Td immunization can help protect your newborn from getting ill. °¨ Rubella immunization. °¨ Varicella (chickenpox) immunization. °¨ Influenza immunization. You should receive this annual immunization if you did not receive the immunization during your pregnancy. °  °This information is not intended to replace advice given to you by your health care provider. Make sure you discuss any questions you have with your health care provider. °  °Document Released: 07/25/2007 Document Revised:  06/21/2012 Document Reviewed: 05/24/2012 °Elsevier Interactive Patient Education ©2016 Else[employer] Inc. ° °

## 2017-11-11 DIAGNOSIS — R9431 Abnormal electrocardiogram [ECG] [EKG]: Secondary | ICD-10-CM

## 2017-11-11 HISTORY — DX: Abnormal electrocardiogram (ECG) (EKG): R94.31

## 2017-11-11 HISTORY — PX: TRANSTHORACIC ECHOCARDIOGRAM: SHX275

## 2017-11-29 ENCOUNTER — Ambulatory Visit (INDEPENDENT_AMBULATORY_CARE_PROVIDER_SITE_OTHER): Payer: 59 | Admitting: Cardiology

## 2017-11-29 ENCOUNTER — Encounter: Payer: Self-pay | Admitting: Cardiology

## 2017-11-29 VITALS — BP 124/83 | HR 61 | Ht 59.5 in | Wt 141.2 lb

## 2017-11-29 DIAGNOSIS — R002 Palpitations: Secondary | ICD-10-CM

## 2017-11-29 DIAGNOSIS — R0789 Other chest pain: Secondary | ICD-10-CM | POA: Diagnosis not present

## 2017-11-29 DIAGNOSIS — I493 Ventricular premature depolarization: Secondary | ICD-10-CM | POA: Insufficient documentation

## 2017-11-29 HISTORY — DX: Other chest pain: R07.89

## 2017-11-29 NOTE — Patient Instructions (Signed)
NO MEDICATION CHANGES     TEST SCHEDULE BOTH AT [address] Your physician has recommended that you wear an event monitor  30 DAYS. Event monitors are medical devices that record the heart's electrical activity. Doctors most often us these monitors to diagnose arrhythmias. Arrhythmias are problems with the speed or rhythm of the heartbeat. The monitor is a small, portable device. You can wear one while you do your normal daily activities. This is usually used to diagnose what is causing palpitations/syncope (passing out).  AND Your physician has requested that you have an echocardiogram. Echocardiography is a painless test that uses sound waves to create images of your heart. It provides your doctor with information about the size and shape of your heart and how well your heart's chambers and valves are working. This procedure takes approximately one hour. There are no restrictions for this procedure.    Your physician recommends that you schedule a follow-up appointment in 2 MONTHS WITH DR HARDING.   Echocardiogram An echocardiogram, or echocardiography, uses sound waves (ultrasound) to produce an image of your heart. The echocardiogram is simple, painless, obtained within a short period of time, and offers valuable information to your health care provider. The images from an echocardiogram can provide information such as:  Evidence of coronary artery disease (CAD).  Heart size.  Heart muscle function.  Heart valve function.  Aneurysm detection.  Evidence of a past heart attack.  Fluid buildup around the heart.  Heart muscle thickening.  Assess heart valve function.  Tell a health care provider about:  Any allergies you have.  All medicines you are taking, including vitamins, herbs, eye drops, creams, and over-the-counter medicines.  Any problems you or family members have had with anesthetic medicines.  Any blood disorders you have.  Any  surgeries you have had.  Any medical conditions you have.  Whether you are pregnant or may be pregnant. What happens before the procedure? No special preparation is needed. Eat and drink normally. What happens during the procedure?  In order to produce an image of your heart, gel will be applied to your chest and a wand-like tool (transducer) will be moved over your chest. The gel will help transmit the sound waves from the transducer. The sound waves will harmlessly bounce off your heart to allow the heart images to be captured in real-time motion. These images will then be recorded.  You may need an IV to receive a medicine that improves the quality of the pictures. What happens after the procedure? You may return to your normal schedule including diet, activities, and medicines, unless your health care provider tells you otherwise. This information is not intended to replace advice given to you by your health care provider. Make sure you discuss any questions you have with your health care provider. Document Released: 09/24/2000 Document Revised: 05/15/2016 Document Reviewed: 06/04/2013 Elsevier Interactive Patient Education  2017 [employer]

## 2017-11-29 NOTE — Progress Notes (Signed)
PCP: Sigmund Hazel, MD  Clinic Note: Chief Complaint  Patient presents with  . New Patient (Initial Visit)    Palpitations    HPI: Stephanie Gomez is a 32 y.o. female with no significant PMH below who is seen today for evaluation of heart palpitations at the request of Stephanie Graff, MD (OB Gyn)  Stephanie Gomez was last seen on September 22, 2018 by Dr. Normand Gomez in the [facility] clinic.  As part of that evaluation she heart skipping beats.  He described his "heart flutters ".  TSH was ordered and cardiology consult placed. TSH and free T4 normal as of February 12.  TSH 1.09.  Free T4 2.2 and T3 uptake 30.  All normal.  Also notable urine pregnancy test was negative.  Recent Hospitalizations: none  Studies Personally Reviewed - (if available, images/films reviewed: From Epic Chart or Care Everywhere)  none  Interval History: Stephanie Gomez is an otherwise healthy young woman with a 60-month-old daughter.  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  She is adopted and therefore is not aware of her family history besides a report that her birth mother may have had a seizure disorder.  [name] notes that she has been having these episodes of feeling as though her heart flip flops with almost a drop out of her chest sensation that may happen as many as 40 times a day, or not happen at all.  These have been happening since about November of last year.  The most she has had is about 41-day.  But otherwise they are not that frequent.  They may happen every other day or maybe once a week as far as having spells.  But they do tend to happen and spells during the course of the day meaning they will be several symptoms in a day and then she can go a couple days without having anything --she says she usually would have at least 1 or 2 spells in a week.  They are not always associated with any type of exertion or activity.  They have occurred at work and at rest, but not with exercise.     In addition to the more prominent episodes, she will have short lasting spells of a quivering fluttering sensation lasting less than a minute bit is not the more forceful symptom.  Sometimes when this happens she feels a little bit of tightness in her chest.  That usually happens more at rest.  She has not had any sensation of lightheadedness or dizziness or near syncope except during 1 of the more prolonged episodes of the fluttering sensation.  For the most part it just is an unusual feeling that has her little bit concerned. She has not had any resting or exertional chest pain with the exception of the unusual sensation in her chest during the fluttering spells. No PND, orthopnea or edema.  No TIA or amaurosis fugax.  She is getting over a viral illness started off his sinus discomfort been full-blown head cold leading down into a little bit of bronchitis type symptoms.  It has been going on for the last couple weeks now finally starting to improve.  Her heart symptoms have predated this condition and it has not necessarily made it any worse.   ROS: A comprehensive was performed. Review of Systems  Constitutional: Positive for malaise/fatigue (Dealing with her URI symptoms). Negative for chills and fever.  HENT: Negative for congestion, nosebleeds and tinnitus.  Eyes: Negative.   Respiratory: Positive for cough (Still present with her URI). Negative for shortness of breath.   Gastrointestinal: Negative for blood in stool, heartburn and melena.  Genitourinary: Negative for hematuria.  Musculoskeletal: Negative.   Skin: Negative.   Neurological: Negative for dizziness, focal weakness, loss of consciousness and headaches (She had some headache during the beginning of her sinus congestion.  But this is improving.).  Psychiatric/Behavioral: Negative for memory loss. The patient is not nervous/anxious and does not have insomnia.   All other systems reviewed and are negative.   I have  reviewed and (if needed) personally updated the patient's problem list, medications, allergies, past medical and surgical history, social and family history.   Past Medical History:  Diagnosis Date  . Dysthymia    Described as mild anxiety and depression  . PONV (postoperative nausea and vomiting)     History reviewed. No pertinent surgical history.  Current Meds  Medication Sig  . ibuprofen (ADVIL,MOTRIN) 800 MG tablet Take 1 tablet (800 mg total) by mouth every 8 (eight) hours as needed.  . Levonorgestrel-Ethinyl Estradiol (SEASONIQUE) 0.15-0.03 &0.01 MG tablet Seasonique 0.15 mg-30 mcg (84)/10 mcg(7) tablets,3 month dose pack  Take 1 tablet every day by oral route.  . Multiple Vitamins-Calcium (ONE-A-DAY WOMENS FORMULA PO) One-A-Day Womens Formula    Allergies  Allergen Reactions  . Amoxil [Amoxicillin] Hives    Has patient had a PCN reaction causing immediate rash, facial/tongue/throat swelling, SOB or lightheadedness with hypotension: Yes Has patient had a PCN reaction causing severe rash involving mucus membranes or skin necrosis: Yes Has patient had a PCN reaction that required hospitalization No Has patient had a PCN reaction occurring within the last 10 years: No If all of the above answers are "NO", then may proceed with Cephalosporin use.   Marland Kitchen. Penicillins Hives    Has patient had a PCN reaction causing immediate rash, facial/tongue/throat swelling, SOB or lightheadedness with hypotension: Yes Has patient had a PCN reaction causing severe rash involving mucus membranes or skin necrosis: Yes Has patient had a PCN reaction that required hospitalization No Has patient had a PCN reaction occurring within the last 10 years: No If all of the above answers are "NO", then may proceed with Cephalosporin use.     Social History   Tobacco Use  . Smoking status: Never Smoker  . Smokeless tobacco: Never Used  Substance Use Topics  . Alcohol use: No    Frequency: Never  . Drug  use: No   Social History   Social History Narrative   She is married.  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   Never smoked.  No alcohol.  No caffeine.    family history includes Seizures in her mother; Skin cancer in her maternal grandfather. She was adopted.  Wt Readings from Last 3 Encounters:  11/29/17 141 lb 3.2 oz (64 kg)  02/26/16 172 lb (78 kg)  02/02/12 129 lb (58.5 kg)    PHYSICAL EXAM BP 124/83   Pulse 61   Ht 4' 11.5" (1.511 m)   Wt 141 lb 3.2 oz (64 kg)   BMI 28.04 kg/m  Physical Exam  Constitutional: She is oriented to person, place, and time. She appears well-developed and well-nourished. No distress.  Well-groomed.  Healthy-appearing  HENT:  Head: Normocephalic and atraumatic.  Nose: Nose normal.  Mouth/Throat: Oropharynx is clear and moist. No oropharyngeal exudate.  Eyes: Conjunctivae and EOM are normal. Pupils are equal, round, and reactive  to light. No scleral icterus.  Neck: Normal range of motion. Neck supple. No JVD present. Carotid bruit is not present. No tracheal deviation present. No thyromegaly present.  Cardiovascular: Normal rate, regular rhythm, normal heart sounds, intact distal pulses and normal pulses.  Occasional extrasystoles are present. PMI is not displaced. Exam reveals no gallop (Cannot exclude mild click) and no friction rub.  No murmur heard. Pulmonary/Chest: Effort normal and breath sounds normal. No respiratory distress. She has no wheezes. She has no rales.  Abdominal: Soft. Bowel sounds are normal. She exhibits no distension. There is no tenderness. There is no rebound.  Musculoskeletal: Normal range of motion. She exhibits no edema or deformity.  Lymphadenopathy:    She has no cervical adenopathy.  Neurological: She is alert and oriented to person, place, and time. No cranial nerve deficit.  Skin: Skin is warm and dry. No rash noted. No erythema.  Psychiatric: She has a normal mood and affect. Her behavior is normal.  Judgment and thought content normal.  Nursing note and vitals reviewed.    Adult ECG Report  Rate: 61;  Rhythm: normal sinus rhythm and Normal axis, intervals and durations.;   Narrative Interpretation: Normal EKG   Other studies Reviewed: Additional studies/ records that were reviewed today include:  Recent Labs: February 2019: TSH 1.09T3 uptake and free T4 normal.  ASSESSMENT / PLAN: Problem List Items Addressed This Visit    Rapid palpitations - Primary    She has both rapid palpitations, and then the more forceful beats which seem more consistent with probably PACs or PVCs.  The rapid palpitations spells may be short little runs of PAT.  They are not happening frequently enough to do a 24 or 48-hour monitor, therefore will need to do a 30-day event monitor. Since I could not exclude a click on exam, we will check a 2D echocardiogram to exclude structural abnormalities.      Relevant Orders   EKG 12-Lead (Completed)   ECHOCARDIOGRAM COMPLETE   CARDIAC EVENT MONITOR   Sensation of chest tightness    She has this mild sensation of chest tightness only associated with her palpitations.  That is probably just feeling the irregular heartbeats, however 1 to exclude some type of structural abnormality since we do not have any past medical history or family history to go from.  Does not sound anginal in nature.  I do not see any reason to check a stress test on this otherwise healthy young woman.      Relevant Orders   EKG 12-Lead (Completed)   ECHOCARDIOGRAM COMPLETE      Current medicines are reviewed at length with the patient today. (+/- concerns) none The following changes have been made:None  Patient Instructions  NO MEDICATION CHANGES  TEST SCHEDULE BOTH AT [address] Your physician has recommended that you wear an event monitor  30 DAYS. Event monitors are medical devices that record the heart's electrical activity. Doctors most often [place] these  monitors to diagnose arrhythmias. Arrhythmias are problems with the speed or rhythm of the heartbeat. The monitor is a small, portable device. You can wear one while you do your normal daily activities. This is usually used to diagnose what is causing palpitations/syncope (passing out).  AND Your physician has requested that you have an echocardiogram. Echocardiography is a painless test that uses sound waves to create images of your heart. It provides your doctor with information about the size and shape of your heart and  how well your heart's chambers and valves are working. This procedure takes approximately one hour. There are no restrictions for this procedure.  Your physician recommends that you schedule a follow-up appointment in 2 MONTHS WITH DR HARDING.   Studies Ordered:   Orders Placed This Encounter  Procedures  . CARDIAC EVENT MONITOR  . EKG 12-Lead  . ECHOCARDIOGRAM COMPLETE      Bryan Lemma, M.D., M.S. Interventional Cardiologist   Pager # [phone] Phone # [phone] [address] [place], [place] [address]   Thank you for choosing [facility] at [facility]!!

## 2017-12-01 ENCOUNTER — Encounter: Payer: Self-pay | Admitting: Cardiology

## 2017-12-01 NOTE — Assessment & Plan Note (Signed)
She has this mild sensation of chest tightness only associated with her palpitations.  That is probably just feeling the irregular heartbeats, however 1 to exclude some type of structural abnormality since we do not have any past medical history or family history to go from.  Does not sound anginal in nature.  I do not see any reason to check a stress test on this otherwise healthy young woman.

## 2017-12-01 NOTE — Assessment & Plan Note (Signed)
She has both rapid palpitations, and then the more forceful beats which seem more consistent with probably PACs or PVCs.  The rapid palpitations spells may be short little runs of PAT.  They are not happening frequently enough to do a 24 or 48-hour monitor, therefore will need to do a 30-day event monitor. Since I could not exclude a click on exam, we will check a 2D echocardiogram to exclude structural abnormalities.

## 2017-12-08 ENCOUNTER — Ambulatory Visit (INDEPENDENT_AMBULATORY_CARE_PROVIDER_SITE_OTHER): Payer: 59

## 2017-12-08 ENCOUNTER — Other Ambulatory Visit: Payer: Self-pay

## 2017-12-08 ENCOUNTER — Ambulatory Visit (HOSPITAL_COMMUNITY): Payer: 59 | Attending: Cardiology

## 2017-12-08 DIAGNOSIS — R002 Palpitations: Secondary | ICD-10-CM

## 2017-12-08 DIAGNOSIS — R0789 Other chest pain: Secondary | ICD-10-CM | POA: Insufficient documentation

## 2017-12-08 LAB — ECHOCARDIOGRAM COMPLETE
CHL CUP DOP CALC LVOT VTI: 19.9 cm
CHL CUP MV DEC (S): 194
CHL CUP TV REG PEAK VELOCITY: 239 cm/s
E/e' ratio: 6.48
EWDT: 194 ms
FS: 33 % (ref 28–44)
IVS/LV PW RATIO, ED: 0.86
LA ID, A-P, ES: 34 mm
LA diam index: 2.13 cm/m2
LA vol A4C: 27.1 ml
LAVOL: 28.8 mL
LAVOLIN: 18 mL/m2
LDCA: 3.14 cm2
LEFT ATRIUM END SYS DIAM: 34 mm
LV E/e' medial: 6.48
LV E/e'average: 6.48
LV TDI E'LATERAL: 13.4
LV TDI E'MEDIAL: 9.68
LVELAT: 13.4 cm/s
LVOT diameter: 20 mm
LVOT peak vel: 96.1 cm/s
LVOTSV: 62 mL
Lateral S' vel: 13.8 cm/s
MV Peak grad: 3 mmHg
MV pk A vel: 42.7 m/s
MV pk E vel: 86.8 m/s
PW: 7 mm — AB (ref 0.6–1.1)
TAPSE: 21.7 mm
TR max vel: 239 cm/s

## 2018-01-31 ENCOUNTER — Ambulatory Visit (INDEPENDENT_AMBULATORY_CARE_PROVIDER_SITE_OTHER): Payer: 59 | Admitting: Cardiology

## 2018-01-31 VITALS — BP 119/81 | HR 62 | Ht 59.5 in | Wt 140.2 lb

## 2018-01-31 DIAGNOSIS — R0789 Other chest pain: Secondary | ICD-10-CM | POA: Diagnosis not present

## 2018-01-31 DIAGNOSIS — I493 Ventricular premature depolarization: Secondary | ICD-10-CM

## 2018-01-31 DIAGNOSIS — F419 Anxiety disorder, unspecified: Secondary | ICD-10-CM | POA: Diagnosis not present

## 2018-01-31 NOTE — Progress Notes (Signed)
PCP: Sigmund Hazel, MD  Clinic Note: Chief Complaint  Patient presents with  . Follow-up    feeling better - no more palipitations  . Palpitations    echo & monitor results    HPI: Stephanie Gomez is a 32 y.o. female with no significant PMH below who is seen today for f/u evaluation of heart palpitations --> she is being seen at the request of Jaymes Graff, MD [facility]) who referred her after she was seen on September 22, 2018 by Dr. Normand Sloop in the [facility] clinic.  As part of that evaluation she heart skipping beats.  He described his "heart flutters ".  TSH was ordered and cardiology consult placed. TSH and free T4 normal as of February 12.  TSH 1.09.  Free T4 2.2 and T3 uptake 30.  All normal.  Also notable urine pregnancy test was negative.  Stephanie Gomez was initially seen on- Nov 29, 2017 --she was noting several episodes of feeling that her heart was flip flopping and beating somewhat fast.  Sometimes a 40 times a day.  They have been occurring since November.  Episodes occurred at both rest and at work.  Not associated with anything in particular.  Was having longer spells lasting roughly a minute maybe twice a week.  She was evaluated with a 30-day event monitor and echocardiogram.  Recent Hospitalizations: none  Studies Personally Reviewed - (if available, images/films reviewed: From Epic Chart or Care Everywhere)  Echo November 30, 2017: EF 50 and 55% with no regional Cornacchia motion normality.  Normal valves.  No mitral prolapse.  Monitor: Monitoring. 2/28-3/29, 2019. -- Patient noted symptoms with any combination of sinus rhythm, sinus tachycardia as well as either with PVCs. Not specifically with any particular reading.  Mostly sinus rhythm. Rare intermittent PVCs (45-142 bpm)   No PACs, PAT or arrhythmia such as SVT, A. fib or a flutter.  PVCs and single with no couplets, bigeminy, trigeminy or runs.   Interval History: Stephanie Gomez is an otherwise healthy young woman with a  87-month-old daughter who returns today to discuss results of her echocardiogram and event monitor. Back actually notes that her symptoms started getting better by the time she saw me, and the month she wore her monitor, she is really not having that much of the way of any symptoms.  She pushed her button several times that did correlate with PVCs that were noted, but she really indicates that these episodes of almost gone away.  This month, since turning in the monitor, she is really has not had any further episodes.  The more strenuous symptoms have not recurred at all.  Off-and-on she may feel a flip-flop but has not even thought about it much since hearing the results of her tests.  No lightheadedness dizziness or wooziness, syncope/near syncope or TIA/amaurosis fugax.  No resting or exertional dyspnea or chest pain.    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  She is adopted and therefore is not aware of her family history besides a report that her birth mother may have had a seizure disorder. She is making sure that she eats and drinks plenty I think that may be helping.   ROS: A comprehensive was performed. Review of Systems  Constitutional: Negative for malaise/fatigue.  HENT: Negative for tinnitus.   Respiratory: Negative for cough (Still present with her URI) and shortness of breath.   Neurological: Negative for dizziness and headaches.  Psychiatric/Behavioral:  The patient is not nervous/anxious.     I have reviewed and (if needed) personally updated the patient's problem list, medications, allergies, past medical and surgical history, social and family history.   Past Medical History:  Diagnosis Date  . Dysthymia    Described as mild anxiety and depression  . patient-activated cardiac event monitor 11/2017   Mostly NSR (some brady & tachy) - rates 45-142 bpm.  Rare PVCs (singles - no couplets, runs or bi-trigeminy), No PACs, PAT or arrhythmia  such as SVT, A. fib or a flutter.  Marland Kitchen. PONV (postoperative nausea and vomiting)     Past Surgical History:  Procedure Laterality Date  . TRANSTHORACIC ECHOCARDIOGRAM  11/2017   EF 50 and 55% with no regional Asche motion normality.  Normal valves.  No mitral prolapse.    Current Meds  Medication Sig  . ibuprofen (ADVIL,MOTRIN) 800 MG tablet Take 1 tablet (800 mg total) by mouth every 8 (eight) hours as needed.    Allergies  Allergen Reactions  . Amoxil [Amoxicillin] Hives    Has patient had a PCN reaction causing immediate rash, facial/tongue/throat swelling, SOB or lightheadedness with hypotension: Yes Has patient had a PCN reaction causing severe rash involving mucus membranes or skin necrosis: Yes Has patient had a PCN reaction that required hospitalization No Has patient had a PCN reaction occurring within the last 10 years: No If all of the above answers are "NO", then may proceed with Cephalosporin use.   Marland Kitchen. Penicillins Hives    Has patient had a PCN reaction causing immediate rash, facial/tongue/throat swelling, SOB or lightheadedness with hypotension: Yes Has patient had a PCN reaction causing severe rash involving mucus membranes or skin necrosis: Yes Has patient had a PCN reaction that required hospitalization No Has patient had a PCN reaction occurring within the last 10 years: No If all of the above answers are "NO", then may proceed with Cephalosporin use.     Social History   Tobacco Use  . Smoking status: Never Smoker  . Smokeless tobacco: Never Used  Substance Use Topics  . Alcohol use: No    Frequency: Never  . Drug use: No   Social History   Social History Narrative   She is married.  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   Never smoked.  No alcohol.  No caffeine.    family history includes Seizures in her mother; Skin cancer in her maternal grandfather. She was adopted.  Wt Readings from Last 3 Encounters:  01/31/18 140 lb 3.2 oz (63.6  kg)  11/29/17 141 lb 3.2 oz (64 kg)  02/26/16 172 lb (78 kg)    PHYSICAL EXAM BP 119/81   Pulse 62   Ht 4' 11.5" (1.511 m)   Wt 140 lb 3.2 oz (63.6 kg)   SpO2 95%   BMI 27.84 kg/m  Physical Exam  Constitutional: She is oriented to person, place, and time. She appears well-developed and well-nourished. No distress.  Well-groomed.  Healthy-appearing  HENT:  Head: Normocephalic and atraumatic.  Nose: Nose normal.  Mouth/Throat: Oropharynx is clear and moist.  Eyes: Conjunctivae and EOM are normal.  Musculoskeletal: Normal range of motion. She exhibits no edema.  Neurological: She is alert and oriented to person, place, and time.  Psychiatric: She has a normal mood and affect. Her behavior is normal. Judgment and thought content normal.  Nursing note and vitals reviewed.    Adult ECG Report n/a  Other studies Reviewed: Additional studies/ records  that were reviewed today include:  Recent Labs: February 2019: TSH 1.09T3 uptake and free T4 normal.  ASSESSMENT / PLAN: Problem List Items Addressed This Visit    Sensation of chest tightness    No longer noticing the symptoms.  They were associated with the palpitations but she is no longer having.  No further work-up needed.      PVC's (premature ventricular contractions) - Primary    Seems like her symptoms have pretty much gone away.  No further episodes.  It is hard to tell what she was really having, because she did not notice the previously noted symptoms while wearing the monitor.  She did have some PVCs on monitor which were not that common.   Relatively benign echocardiogram is reassuring. As she is relatively asymptomatic now, I would recommend conservative management.  Encourage hydration.  Would avoid beta-blocker.      Anxiety    I suspect that some of her spells are related to anxiety or stress.  We talked about biofeedback technique to reduce palpitations.         Current medicines are reviewed at length with  the patient today. (+/- concerns) none The following changes have been made:None  Patient Instructions  NO CHANGES AT CURRENT TIME    Your physician recommends that you schedule a follow-up appointment ON AN AS NEEDED BASIS    Studies Ordered:   No orders of the defined types were placed in this encounter.     Bryan Lemma, M.D., M.S. Interventional Cardiologist   Pager # [phone] Phone # [phone] [address] [place], [place] [address]   Thank you for choosing [facility] at [facility]!!

## 2018-01-31 NOTE — Patient Instructions (Signed)
NO CHANGES AT CURRENT TIME    Your physician recommends that you schedule a follow-up appointment ON AN AS NEEDED BASIS

## 2018-02-01 ENCOUNTER — Encounter: Payer: Self-pay | Admitting: Cardiology

## 2018-02-01 NOTE — Assessment & Plan Note (Signed)
Seems like her symptoms have pretty much gone away.  No further episodes.  It is hard to tell what she was really having, because she did not notice the previously noted symptoms while wearing the monitor.  She did have some PVCs on monitor which were not that common.   Relatively benign echocardiogram is reassuring. As she is relatively asymptomatic now, I would recommend conservative management.  Encourage hydration.  Would avoid beta-blocker.

## 2018-02-01 NOTE — Assessment & Plan Note (Signed)
I suspect that some of her spells are related to anxiety or stress.  We talked about biofeedback technique to reduce palpitations.

## 2018-02-01 NOTE — Assessment & Plan Note (Signed)
No longer noticing the symptoms.  They were associated with the palpitations but she is no longer having.  No further work-up needed.

## 2018-04-28 DIAGNOSIS — M25561 Pain in right knee: Secondary | ICD-10-CM | POA: Insufficient documentation

## 2018-04-28 DIAGNOSIS — M25551 Pain in right hip: Secondary | ICD-10-CM | POA: Insufficient documentation

## 2018-11-24 ENCOUNTER — Ambulatory Visit
Admission: EM | Admit: 2018-11-24 | Discharge: 2018-11-24 | Disposition: A | Discharge status: Home / Self Care | Payer: 59 | Attending: Physician Assistant | Admitting: Physician Assistant

## 2018-11-24 ENCOUNTER — Encounter: Payer: Self-pay | Admitting: Emergency Medicine

## 2018-11-24 DIAGNOSIS — J22 Unspecified acute lower respiratory infection: Secondary | ICD-10-CM | POA: Diagnosis not present

## 2018-11-24 MED ORDER — DOXYCYCLINE HYCLATE 100 MG PO CAPS: 100.0000 mg | ORAL_CAPSULE | Freq: Two times a day (BID) | ORAL | 0 refills | Status: DC

## 2018-11-24 MED ORDER — BENZONATATE 100 MG PO CAPS: 100.0000 mg | ORAL_CAPSULE | Freq: Three times a day (TID) | ORAL | 0 refills | Status: DC

## 2018-11-24 NOTE — Discharge Instructions (Addendum)
Start doxycycline as directed. Tessalon for cough. Over the counter flonase for nasal congestion/drainage. You can use over the counter nasal saline rinse such as neti pot for nasal congestion. Keep hydrated, your urine should be clear to pale yellow in color. Tylenol/motrin for fever and pain. Monitor for any worsening of symptoms, chest pain, shortness of breath, wheezing, swelling of the throat, follow up for reevaluation.   For sore throat/cough try using a honey-based tea. Use 3 teaspoons of honey with juice squeezed from half lemon. Place shaved pieces of ginger into 1/2-1 cup of water and warm over stove top. Then mix the ingredients and repeat every 4 hours as needed.

## 2018-11-24 NOTE — ED Triage Notes (Signed)
Pt presents to [facility] for assessment of 1.5 weeks of cough.  Cough started Tuesday, fever started Friday lasting through the weekend, and then patient states cough has been worsening through the week.  States now she is having difficulty breathing with exertion, right sided chest pain, constant but worse when coughing.

## 2018-11-24 NOTE — ED Provider Notes (Signed)
[facility]    CSN: [id number] Arrival date & time: 11/24/18  1629     History   Chief Complaint Chief Complaint  Patient presents with  . Cough    HPI Stephanie Gomez is a 33 y.o. female.   33 year old female comes in for 1.5-week history of URI symptoms.  States started with rhinorrhea, nasal congestion, fever.  T-max 101, last fever about a week ago.  States OTC cold medicine has resolved nasal congestion and rhinorrhea, but cough has been worsening.  States now with productive cough, having dyspnea on exertion, right-sided chest pain with cough.  States at baseline without shortness of breath.  Never smoker.  Positive sick contact.     Past Medical History:  Diagnosis Date  . Dysthymia    Described as mild anxiety and depression  . patient-activated cardiac event monitor 11/2017   Mostly NSR (some brady & tachy) - rates 45-142 bpm.  Rare PVCs (singles - no couplets, runs or bi-trigeminy), No PACs, PAT or arrhythmia such as SVT, A. fib or a flutter.  Marland Kitchen. PONV (postoperative nausea and vomiting)     Patient Active Problem List   Diagnosis Date Noted  . PVC's (premature ventricular contractions) 11/29/2017  . Sensation of chest tightness 11/29/2017  . Vaginal delivery 02/27/2016  . Obesity (BMI 30.0-34.9) 02/26/2016  . Anxiety 02/26/2016  . Allergy to penicillin 02/26/2016    Past Surgical History:  Procedure Laterality Date  . TRANSTHORACIC ECHOCARDIOGRAM  11/2017   EF 50 and 55% with no regional Mohler motion normality.  Normal valves.  No mitral prolapse.    OB History    Gravida  1   Para  1   Term  1   Preterm      AB      Living  1     SAB      TAB      Ectopic      Multiple  0   Live Births  1            Home Medications    Prior to Admission medications   Medication Sig Start Date End Date Taking? Authorizing Provider  benzonatate (TESSALON) 100 MG capsule Take 1 capsule (100 mg total) by mouth every 8 (eight) hours.  11/24/18   Cathie HoopsYu, Zaliyah Meikle V, PA-C  doxycycline (VIBRAMYCIN) 100 MG capsule Take 1 capsule (100 mg total) by mouth 2 (two) times daily. 11/24/18   Cathie HoopsYu, Aubry Tucholski V, PA-C  ibuprofen (ADVIL,MOTRIN) 800 MG tablet Take 1 tablet (800 mg total) by mouth every 8 (eight) hours as needed. 02/28/16   Kirkland HunStringer, Arthur, MD    Family History Family History  Adopted: Yes  Problem Relation Age of Onset  . Seizures Mother        She is not aware of any details.  This is her birth mother, and she has limited information  . Skin cancer Maternal Grandfather     Social History Social History   Tobacco Use  . Smoking status: Never Smoker  . Smokeless tobacco: Never Used  Substance Use Topics  . Alcohol use: No    Frequency: Never  . Drug use: No     Allergies   Amoxil [amoxicillin] and Penicillins   Review of Systems Review of Systems  Reason unable to perform ROS: See HPI as above.     Physical Exam Triage Vital Signs ED Triage Vitals  Enc Vitals Group     BP 11/24/18 1641 117/83  Pulse Rate 11/24/18 1641 92     Resp 11/24/18 1641 20     Temp 11/24/18 1641 98.4 F (36.9 C)     Temp Source 11/24/18 1641 Oral     SpO2 11/24/18 1641 95 %     Weight --      Height --      Head Circumference --      Peak Flow --      Pain Score 11/24/18 1642 5     Pain Loc --      Pain Edu? --      Excl. in GC? --    No data found.  Updated Vital Signs BP 117/83 (BP Location: Left Arm)   Pulse 92   Temp 98.4 F (36.9 C) (Oral)   Resp 20   LMP 10/16/2018   SpO2 95%   Visual Acuity Right Eye Distance:   Left Eye Distance:   Bilateral Distance:    Right Eye Near:   Left Eye Near:    Bilateral Near:     Physical Exam Constitutional:      General: She is not in acute distress.    Appearance: She is well-developed. She is not ill-appearing, toxic-appearing or diaphoretic.  HENT:     Head: Normocephalic and atraumatic.     Right Ear: Tympanic membrane, ear canal and external ear normal. Tympanic  membrane is not erythematous or bulging.     Left Ear: Tympanic membrane, ear canal and external ear normal. Tympanic membrane is not erythematous or bulging.     Nose: Nose normal.     Right Sinus: No maxillary sinus tenderness or frontal sinus tenderness.     Left Sinus: No maxillary sinus tenderness or frontal sinus tenderness.     Mouth/Throat:     Mouth: Mucous membranes are moist.     Pharynx: Oropharynx is clear. Uvula midline.  Eyes:     Conjunctiva/sclera: Conjunctivae normal.     Pupils: Pupils are equal, round, and reactive to light.  Neck:     Musculoskeletal: Normal range of motion and neck supple.  Cardiovascular:     Rate and Rhythm: Normal rate and regular rhythm.     Heart sounds: Normal heart sounds. No murmur. No friction rub. No gallop.   Pulmonary:     Effort: Pulmonary effort is normal. No tachypnea, accessory muscle usage, prolonged expiration or respiratory distress.     Comments: Mild crackles to the left lower field that is slightly resolved with cough.  Adequate air movement. Lymphadenopathy:     Cervical: No cervical adenopathy.  Skin:    General: Skin is warm and dry.  Neurological:     Mental Status: She is alert and oriented to person, place, and time.  Psychiatric:        Behavior: Behavior normal.        Judgment: Judgment normal.      UC Treatments / Results  Labs (all labs ordered are listed, but only abnormal results are displayed) Labs Reviewed - No data to display  EKG None  Radiology No results found.  Procedures Procedures (including critical care time)  Medications Ordered in UC Medications - No data to display  Initial Impression / Assessment and Plan / UC Course  I have reviewed the triage vital signs and the nursing notes.  Pertinent labs & imaging results that were available during my care of the patient were reviewed by me and considered in my medical decision making (see chart for details).  Will cover for lower  respiratory infection with doxycycline.  Other symptomatic treatment discussed.  Push fluids.  Return precautions given.  Patient expresses understanding and agrees to plan.  Final Clinical Impressions(s) / UC Diagnoses   Final diagnoses:  Lower respiratory infection    ED Prescriptions    Medication Sig Dispense Auth. Provider   doxycycline (VIBRAMYCIN) 100 MG capsule Take 1 capsule (100 mg total) by mouth 2 (two) times daily. 20 capsule Kawan Valladolid V, PA-C   benzonatate (TESSALON) 100 MG capsule Take 1 capsule (100 mg total) by mouth every 8 (eight) hours. 21 capsule Threasa Alpha, [place] 11/24/18 1705

## 2018-11-24 NOTE — ED Notes (Signed)
Patient able to ambulate independently  

## 2018-11-27 ENCOUNTER — Ambulatory Visit
Admission: EM | Admit: 2018-11-27 | Discharge: 2018-11-27 | Disposition: A | Discharge status: Home / Self Care | Payer: 59 | Attending: Family Medicine | Admitting: Family Medicine

## 2018-11-27 ENCOUNTER — Encounter: Payer: Self-pay | Admitting: Emergency Medicine

## 2018-11-27 ENCOUNTER — Ambulatory Visit (INDEPENDENT_AMBULATORY_CARE_PROVIDER_SITE_OTHER): Payer: 59

## 2018-11-27 ENCOUNTER — Telehealth (HOSPITAL_COMMUNITY): Payer: Self-pay | Admitting: Emergency Medicine

## 2018-11-27 DIAGNOSIS — J209 Acute bronchitis, unspecified: Secondary | ICD-10-CM | POA: Diagnosis not present

## 2018-11-27 MED ORDER — ALBUTEROL SULFATE HFA 108 (90 BASE) MCG/ACT IN AERS: 1.0000 | INHALATION_SPRAY | Freq: Four times a day (QID) | RESPIRATORY_TRACT | 0 refills | Status: DC | PRN

## 2018-11-27 MED ORDER — PREDNISONE 50 MG PO TABS: ORAL_TABLET | ORAL | 0 refills | Status: DC

## 2018-11-27 MED ORDER — IPRATROPIUM-ALBUTEROL 0.5-2.5 (3) MG/3ML IN SOLN
3.0000 mL | Freq: Once | RESPIRATORY_TRACT | Status: AC
  Administered 2018-11-27: 3 mL via RESPIRATORY_TRACT

## 2018-11-27 MED ORDER — SPACER/AERO-HOLDING CHAMBERS DEVI: 1.0000 "application " | Freq: Once | 0 refills | Status: AC

## 2018-11-27 NOTE — ED Notes (Signed)
Pt had multiple questions about medications and continued treatment at d/c.  Will have provider return to review.

## 2018-11-27 NOTE — ED Notes (Signed)
Patient able to ambulate independently  

## 2018-11-27 NOTE — ED Triage Notes (Signed)
Pt presents to [facility] for assessment of continued nasal congestion, fever.  Seen here Friday.  States SOB is worsening, gets SOB with exertion, or talking in long sentences.

## 2018-11-27 NOTE — Telephone Encounter (Signed)
Returned patients voicemail stating she doesn't feel better. No answer, voicemail left. If patient returns call, please advise her she needs to be rechecked if she is feeling worse.

## 2018-11-27 NOTE — ED Provider Notes (Signed)
[facility]    CSN: [id number] Arrival date & time: 11/27/18  1535     History   Chief Complaint Chief Complaint  Patient presents with  . URI    HPI Clayburn PertRebecca N Girdler is a 33 y.o. female no contributing past medical history presenting today for evaluation of cough and shortness of breath.  Patient states that for the past 2 weeks she has had congestion, cough.  She was seen here on Friday, approximately 4 days ago and was treated for lower respiratory infection and initiated on doxycycline and Tessalon as needed.  Since she has felt worsening of her symptoms.  She feels as if she cannot take a full deep breath.  She has also noted discomfort in her right lower ribs.  She denies any fevers.  She does endorse wheezing and noisy lungs.  Denies history of asthma.  HPI  Past Medical History:  Diagnosis Date  . Dysthymia    Described as mild anxiety and depression  . patient-activated cardiac event monitor 11/2017   Mostly NSR (some brady & tachy) - rates 45-142 bpm.  Rare PVCs (singles - no couplets, runs or bi-trigeminy), No PACs, PAT or arrhythmia such as SVT, A. fib or a flutter.  Marland Kitchen. PONV (postoperative nausea and vomiting)     Patient Active Problem List   Diagnosis Date Noted  . PVC's (premature ventricular contractions) 11/29/2017  . Sensation of chest tightness 11/29/2017  . Vaginal delivery 02/27/2016  . Obesity (BMI 30.0-34.9) 02/26/2016  . Anxiety 02/26/2016  . Allergy to penicillin 02/26/2016    Past Surgical History:  Procedure Laterality Date  . TRANSTHORACIC ECHOCARDIOGRAM  11/2017   EF 50 and 55% with no regional Sones motion normality.  Normal valves.  No mitral prolapse.    OB History    Gravida  1   Para  1   Term  1   Preterm      AB      Living  1     SAB      TAB      Ectopic      Multiple  0   Live Births  1            Home Medications    Prior to Admission medications   Medication Sig Start Date End Date Taking?  Authorizing Provider  benzonatate (TESSALON) 100 MG capsule Take 1 capsule (100 mg total) by mouth every 8 (eight) hours. 11/24/18  Yes Yu, Amy V, PA-C  doxycycline (VIBRAMYCIN) 100 MG capsule Take 1 capsule (100 mg total) by mouth 2 (two) times daily. 11/24/18  Yes Yu, Amy V, PA-C  albuterol (PROVENTIL HFA;VENTOLIN HFA) 108 (90 Base) MCG/ACT inhaler Inhale 1-2 puffs into the lungs every 6 (six) hours as needed for wheezing or shortness of breath. 11/27/18   Wieters, Hallie C, PA-C  ibuprofen (ADVIL,MOTRIN) 800 MG tablet Take 1 tablet (800 mg total) by mouth every 8 (eight) hours as needed. 02/28/16   Kirkland HunStringer, Arthur, MD  predniSONE (DELTASONE) 50 MG tablet Take daily for 5 days with food, in the morning 11/27/18   Wieters, GlencoeHallie C, PA-C  Spacer/Aero-Holding [place] DEVI 1 application by Does not apply route once for 1 dose. 11/27/18 11/27/18  Wieters, Junius CreamerHallie C, PA-C    Family History Family History  Adopted: Yes  Problem Relation Age of Onset  . Seizures Mother        She is not aware of any details.  This is her birth mother,  and she has limited information  . Skin cancer Maternal Grandfather     Social History Social History   Tobacco Use  . Smoking status: Never Smoker  . Smokeless tobacco: Never Used  Substance Use Topics  . Alcohol use: No    Frequency: Never  . Drug use: No     Allergies   Amoxil [amoxicillin] and Penicillins   Review of Systems Review of Systems  Constitutional: Negative for activity change, appetite change, chills, fatigue and fever.  HENT: Positive for congestion, rhinorrhea and sore throat. Negative for ear pain, sinus pressure and trouble swallowing.   Eyes: Negative for discharge and redness.  Respiratory: Positive for cough, chest tightness, shortness of breath and wheezing.   Cardiovascular: Negative for chest pain.  Gastrointestinal: Negative for abdominal pain, diarrhea, nausea and vomiting.  Musculoskeletal: Negative for myalgias.  Skin:  Negative for rash.  Neurological: Negative for dizziness, light-headedness and headaches.     Physical Exam Triage Vital Signs ED Triage Vitals [11/27/18 1547]  Enc Vitals Group     BP 120/82     Pulse Rate 97     Resp 20     Temp 97.8 F (36.6 C)     Temp Source Oral     SpO2 94 %     Weight      Height      Head Circumference      Peak Flow      Pain Score 9     Pain Loc      Pain Edu?      Excl. in GC?    No data found.  Updated Vital Signs BP 120/82 (BP Location: Left Arm)   Pulse 97   Temp 97.8 F (36.6 C) (Oral)   Resp 20   SpO2 94%   Visual Acuity Right Eye Distance:   Left Eye Distance:   Bilateral Distance:    Right Eye Near:   Left Eye Near:    Bilateral Near:     Physical Exam Vitals signs and nursing note reviewed.  Constitutional:      General: She is not in acute distress.    Appearance: She is well-developed.  HENT:     Head: Normocephalic and atraumatic.     Ears:     Comments: Bilateral ears without tenderness to palpation of external auricle, tragus and mastoid, EAC's without erythema or swelling, TM's with good bony landmarks and cone of light. Non erythematous.    Nose:     Comments: Nasal mucosa slightly erythematous, no rhinorrhea present    Mouth/Throat:     Comments: Oral mucosa pink and moist, no tonsillar enlargement or exudate. Posterior pharynx patent and nonerythematous, no uvula deviation or swelling. Normal phonation. Eyes:     Conjunctiva/sclera: Conjunctivae normal.  Neck:     Musculoskeletal: Neck supple.  Cardiovascular:     Rate and Rhythm: Normal rate and regular rhythm.     Heart sounds: No murmur.  Pulmonary:     Effort: Pulmonary effort is normal. No respiratory distress.     Breath sounds: Wheezing and rhonchi present.     Comments: Bilateral lungs with significant wheezing and rhonchi with expiration and inspiration Tenderness to palpation in chest to right lower ribs anteriorly and in midaxillary  line Abdominal:     Palpations: Abdomen is soft.     Tenderness: There is no abdominal tenderness.  Skin:    General: Skin is warm and dry.  Neurological:     Mental  Status: She is alert.      UC Treatments / Results  Labs (all labs ordered are listed, but only abnormal results are displayed) Labs Reviewed - No data to display  EKG None  Radiology Dg Chest 2 View  Result Date: 11/27/2018 CLINICAL DATA:  33 year old female with a history of chest pain and shortness of breath EXAM: CHEST - 2 VIEW COMPARISON:  None. FINDINGS: The heart size and mediastinal contours are within normal limits. Both lungs are clear. The visualized skeletal structures are unremarkable. IMPRESSION: Negative for acute cardiopulmonary disease Electronically Signed   By: Gilmer Mor D.O.   On: 11/27/2018 16:05    Procedures Procedures (including critical care time)  Medications Ordered in UC Medications  ipratropium-albuterol (DUONEB) 0.5-2.5 (3) MG/3ML nebulizer solution 3 mL (3 mLs Nebulization Given 11/27/18 1622)    Initial Impression / Assessment and Plan / UC Course  I have reviewed the triage vital signs and the nursing notes.  Pertinent labs & imaging results that were available during my care of the patient were reviewed by me and considered in my medical decision making (see chart for details).     Patient with bronchitis, chest x-ray negative for pneumonia.  DuoNeb provided in clinic.  Mild improvement of adventitious sounds, but still persistent.  Will send home with prednisone and albuterol inhaler, continue Tessalon and doxycycline course.  Continue to monitor,Discussed strict return precautions. Patient verbalized understanding and is agreeable with plan.  Final Clinical Impressions(s) / UC Diagnoses   Final diagnoses:  Acute bronchitis, unspecified organism     Discharge Instructions     Please continue course of doxycycline Begin prednisone daily for the next 5 days with  food on your stomach, take in the morning if you are able Albuterol inhaler 1 to 2 puffs every 4-6 hours as needed for shortness of breath, chest tightness and wheezing, please use attached instructions to help with inhaler use Continue Tessalon as needed  Please follow-up if continuing to have shortness of breath, difficulty breathing or discomfort in your side   ED Prescriptions    Medication Sig Dispense Auth. Provider   predniSONE (DELTASONE) 50 MG tablet Take daily for 5 days with food, in the morning 5 tablet Wieters, Hallie C, PA-C   albuterol (PROVENTIL HFA;VENTOLIN HFA) 108 (90 Base) MCG/ACT inhaler Inhale 1-2 puffs into the lungs every 6 (six) hours as needed for wheezing or shortness of breath. 1 Inhaler Wieters, Archer C, PA-C   Spacer/Aero-Holding Chambers DEVI 1 application by Does not apply route once for 1 dose. 1 each Wieters, Junius Creamer, PA-C     Controlled Substance Prescriptions [place] Controlled Substance Registry consulted? Not Applicable   Lew Dawes, [place] 11/27/18 1646

## 2018-11-27 NOTE — Discharge Instructions (Addendum)
Please continue course of doxycycline Begin prednisone daily for the next 5 days with food on your stomach, take in the morning if you are able Albuterol inhaler 1 to 2 puffs every 4-6 hours as needed for shortness of breath, chest tightness and wheezing, please use attached instructions to help with inhaler use Continue Tessalon as needed  Please follow-up if continuing to have shortness of breath, difficulty breathing or discomfort in your side

## 2018-12-01 ENCOUNTER — Ambulatory Visit
Admission: EM | Admit: 2018-12-01 | Discharge: 2018-12-01 | Disposition: A | Discharge status: Home / Self Care | Payer: 59 | Attending: Family Medicine | Admitting: Family Medicine

## 2018-12-01 DIAGNOSIS — R0781 Pleurodynia: Secondary | ICD-10-CM

## 2018-12-01 MED ORDER — CYCLOBENZAPRINE HCL 5 MG PO TABS: 5.0000 mg | ORAL_TABLET | Freq: Two times a day (BID) | ORAL | 0 refills | Status: DC | PRN

## 2018-12-01 MED ORDER — IBUPROFEN 800 MG PO TABS: 800.0000 mg | ORAL_TABLET | Freq: Three times a day (TID) | ORAL | 0 refills | Status: DC

## 2018-12-01 NOTE — ED Triage Notes (Signed)
Pt c/o rt rib pain radiating through to back after coughing. Pain on deep breath

## 2018-12-01 NOTE — Discharge Instructions (Signed)
Use anti-inflammatories for pain/swelling. You may take up to 800 mg Ibuprofen every 8 hours with food. You may supplement Ibuprofen with Tylenol [phone] mg every 8 hours.   You may use flexeril as needed to help with pain. This is a muscle relaxer and causes sedation- please use only at bedtime or when you will be home and not have to drive/work-begin with 1 tablet, may increase to 2 if not causing significant drowsiness  Ice and heat  Please follow-up if developing fevers, worsening cough, shortness of breath, worsening pain, difficulty breathing

## 2018-12-02 ENCOUNTER — Emergency Department (HOSPITAL_COMMUNITY): Payer: 59

## 2018-12-02 ENCOUNTER — Emergency Department (HOSPITAL_COMMUNITY)
Admission: EM | Admit: 2018-12-02 | Discharge: 2018-12-02 | Disposition: A | Discharge status: Home / Self Care | Payer: 59 | Attending: Emergency Medicine | Admitting: Emergency Medicine

## 2018-12-02 ENCOUNTER — Encounter (HOSPITAL_COMMUNITY): Payer: Self-pay

## 2018-12-02 DIAGNOSIS — Z79899 Other long term (current) drug therapy: Secondary | ICD-10-CM | POA: Diagnosis not present

## 2018-12-02 DIAGNOSIS — R0789 Other chest pain: Secondary | ICD-10-CM | POA: Diagnosis present

## 2018-12-02 MED ORDER — ONDANSETRON 4 MG PO TBDP: ORAL_TABLET | ORAL | 0 refills | Status: DC

## 2018-12-02 MED ORDER — TRAMADOL HCL 50 MG PO TABS: 50.0000 mg | ORAL_TABLET | Freq: Four times a day (QID) | ORAL | 0 refills | Status: DC | PRN

## 2018-12-02 NOTE — ED Triage Notes (Signed)
Pt reports cough x 2.5 weeks. Pt was told PNA by UC and started on abx, still taking. Pt went back to UC, had CXR and told bronchitis. Yesterday, pt coughing felt/heard a "pop" on the front right side of ribs. Pt reports UC told her it was likely a pulled muscle and started on ibuprofen and muscle relaxer. Pain is worse with movement or coughing. Pt took 800 mg of ibuprofen around 1400.

## 2018-12-02 NOTE — ED Provider Notes (Signed)
[facility]    CSN: [id number] Arrival date & time: 12/01/18  1941     History   Chief Complaint Chief Complaint  Patient presents with  . Back Pain    HPI Stephanie Gomez is a 33 y.o. female no contributing past medical history presenting today for evaluation of right rib pain.  Patient has recently been treated for bronchitis and is being seen here twice of recently.  She is finally had some improvement in her shortness of breath and coughing with initiation of prednisone.  She feels overall her bronchitis symptoms are moving in the right direction.  She has 1 more day of prednisone as well as a few more days of doxycycline.  She notes that a couple hours ago she had a cough that worsened pain in her right lower area.  She already had some muscular pain in this area from coughing but this was improving, this cough worsened the pain again, pain is been wrapping to back.  Believes she felt a pop when it happened.  Denies shortness of breath at this time.  HPI  Past Medical History:  Diagnosis Date  . Dysthymia    Described as mild anxiety and depression  . patient-activated cardiac event monitor 11/2017   Mostly NSR (some brady & tachy) - rates 45-142 bpm.  Rare PVCs (singles - no couplets, runs or bi-trigeminy), No PACs, PAT or arrhythmia such as SVT, A. fib or a flutter.  Marland Kitchen PONV (postoperative nausea and vomiting)     Patient Active Problem List   Diagnosis Date Noted  . PVC's (premature ventricular contractions) 11/29/2017  . Sensation of chest tightness 11/29/2017  . Vaginal delivery 02/27/2016  . Obesity (BMI 30.0-34.9) 02/26/2016  . Anxiety 02/26/2016  . Allergy to penicillin 02/26/2016    Past Surgical History:  Procedure Laterality Date  . TRANSTHORACIC ECHOCARDIOGRAM  11/2017   EF 50 and 55% with no regional Iott motion normality.  Normal valves.  No mitral prolapse.    OB History    Gravida  1   Para  1   Term  1   Preterm      AB      Living  1     SAB      TAB      Ectopic      Multiple  0   Live Births  1            Home Medications    Prior to Admission medications   Medication Sig Start Date End Date Taking? Authorizing Provider  albuterol (PROVENTIL HFA;VENTOLIN HFA) 108 (90 Base) MCG/ACT inhaler Inhale 1-2 puffs into the lungs every 6 (six) hours as needed for wheezing or shortness of breath. 11/27/18   ,  C, PA-C  benzonatate (TESSALON) 100 MG capsule Take 1 capsule (100 mg total) by mouth every 8 (eight) hours. 11/24/18   Cathie Hoops, Amy V, PA-C  cyclobenzaprine (FLEXERIL) 5 MG tablet Take 1-2 tablets (5-10 mg total) by mouth 2 (two) times daily as needed for muscle spasms. 12/01/18   ,  C, PA-C  doxycycline (VIBRAMYCIN) 100 MG capsule Take 1 capsule (100 mg total) by mouth 2 (two) times daily. 11/24/18   Cathie Hoops, Amy V, PA-C  ibuprofen (ADVIL,MOTRIN) 800 MG tablet Take 1 tablet (800 mg total) by mouth 3 (three) times daily. 12/01/18   ,  C, PA-C  predniSONE (DELTASONE) 50 MG tablet Take daily for 5 days with food, in the morning 11/27/18  , Junius Creamer, PA-C    Family History Family History  Adopted: Yes  Problem Relation Age of Onset  . Seizures Mother        She is not aware of any details.  This is her birth mother, and she has limited information  . Skin cancer Maternal Grandfather     Social History Social History   Tobacco Use  . Smoking status: Never Smoker  . Smokeless tobacco: Never Used  Substance Use Topics  . Alcohol use: No    Frequency: Never  . Drug use: No     Allergies   Amoxil [amoxicillin] and Penicillins   Review of Systems Review of Systems  Constitutional: Negative for activity change, appetite change, chills, fatigue and fever.  HENT: Positive for congestion and rhinorrhea. Negative for ear pain, sinus pressure, sore throat and trouble swallowing.   Eyes: Negative for discharge and redness.  Respiratory: Positive for cough and  wheezing. Negative for chest tightness and shortness of breath.   Cardiovascular: Positive for chest pain.  Gastrointestinal: Negative for abdominal pain, diarrhea, nausea and vomiting.  Musculoskeletal: Negative for myalgias.  Skin: Negative for rash.  Neurological: Negative for dizziness, light-headedness and headaches.     Physical Exam Triage Vital Signs ED Triage Vitals  Enc Vitals Group     BP 12/01/18 1949 124/84     Pulse Rate 12/01/18 1949 71     Resp 12/01/18 1949 18     Temp 12/01/18 1949 97.7 F (36.5 C)     Temp Source 12/01/18 1949 Oral     SpO2 12/01/18 1949 96 %     Weight --      Height --      Head Circumference --      Peak Flow --      Pain Score 12/01/18 1950 10     Pain Loc --      Pain Edu? --      Excl. in GC? --    No data found.  Updated Vital Signs BP 124/84 (BP Location: Left Arm)   Pulse 71   Temp 97.7 F (36.5 C) (Oral)   Resp 18   SpO2 96%   Visual Acuity Right Eye Distance:   Left Eye Distance:   Bilateral Distance:    Right Eye Near:   Left Eye Near:    Bilateral Near:     Physical Exam Vitals signs and nursing note reviewed.  Constitutional:      General: She is not in acute distress.    Appearance: She is well-developed.  HENT:     Head: Normocephalic and atraumatic.     Ears:     Comments: Bilateral ears without tenderness to palpation of external auricle, tragus and mastoid, EAC's without erythema or swelling, TM's with good bony landmarks and cone of light. Non erythematous.    Mouth/Throat:     Comments: Oral mucosa pink and moist, no tonsillar enlargement or exudate. Posterior pharynx patent and nonerythematous, no uvula deviation or swelling. Normal phonation. Eyes:     Conjunctiva/sclera: Conjunctivae normal.  Neck:     Musculoskeletal: Neck supple.  Cardiovascular:     Rate and Rhythm: Normal rate and regular rhythm.     Heart sounds: No murmur.  Pulmonary:     Effort: Pulmonary effort is normal. No  respiratory distress.     Breath sounds: Wheezing present.     Comments: Breathing comfortably at rest, no cough during visit, inconsistent faint expiratory wheezing in bilateral  lung fields; lung sounds present throughout all fields Chest:     Chest Hirschmann: Tenderness present.  Abdominal:     Palpations: Abdomen is soft.     Tenderness: There is no abdominal tenderness.  Musculoskeletal:     Comments: Tenderness to palpation extending from lower thoracic area wrapping around flank and into the anterior lower ribs, increased tenderness on anterior aspect compared to back  Skin:    General: Skin is warm and dry.  Neurological:     Mental Status: She is alert.      UC Treatments / Results  Labs (all labs ordered are listed, but only abnormal results are displayed) Labs Reviewed - No data to display  EKG None  Radiology No results found.  Procedures Procedures (including critical care time)  Medications Ordered in UC Medications - No data to display  Initial Impression / Assessment and Plan / UC Course  I have reviewed the triage vital signs and the nursing notes.  Pertinent labs & imaging results that were available during my care of the patient were reviewed by me and considered in my medical decision making (see chart for details).     Discussed with patient possible muscular cause versus rib fracture.  Patient states that the cough was not violent, feel muscular causes still most likely given pain wraps around side versus more focal.  Deferring imaging today to avoid further radiation as she recently had a chest x-ray and likely will not change the management of her pain.  Continue anti-inflammatories, will add in muscle relaxer.  Ice and heat.  Continue to monitor, monitor for continued resolution of bronchitis symptoms, follow-up if developing worsening symptoms, symptoms regressing worsening.  Pain not improving.Discussed strict return precautions. Patient verbalized  understanding and is agreeable with plan.  Final Clinical Impressions(s) / UC Diagnoses   Final diagnoses:  Rib pain on right side     Discharge Instructions     Use anti-inflammatories for pain/swelling. You may take up to 800 mg Ibuprofen every 8 hours with food. You may supplement Ibuprofen with Tylenol [phone] mg every 8 hours.   You may use flexeril as needed to help with pain. This is a muscle relaxer and causes sedation- please use only at bedtime or when you will be home and not have to drive/work-begin with 1 tablet, may increase to 2 if not causing significant drowsiness  Ice and heat  Please follow-up if developing fevers, worsening cough, shortness of breath, worsening pain, difficulty breathing   ED Prescriptions    Medication Sig Dispense Auth. Provider   ibuprofen (ADVIL,MOTRIN) 800 MG tablet Take 1 tablet (800 mg total) by mouth 3 (three) times daily. 21 tablet ,  C, PA-C   cyclobenzaprine (FLEXERIL) 5 MG tablet Take 1-2 tablets (5-10 mg total) by mouth 2 (two) times daily as needed for muscle spasms. 24 tablet , [place] C, PA-C     Controlled Substance Prescriptions [place] Controlled Substance Registry consulted? Not Applicable   Lew Dawes, [place] 12/02/18 1006

## 2018-12-02 NOTE — ED Provider Notes (Signed)
[facility] EMERGENCY DEPARTMENT Provider Note   CSN: [id number] Arrival date & time: 12/02/18  1419    History   Chief Complaint Chief Complaint  Patient presents with  . Rib Injury    HPI MERISSA FUNKHOUSER is a 33 y.o. female.     Patient is a 33 year old female who presents with right rib pain.  She is recently been treated for bronchitis.  She is completed a course of prednisone and is almost done with doxycycline.  She says overall her bronchitis symptoms have improved but she has been having some soreness in her right ribs for the last 2 to 3 days.  Yesterday she felt a pop in her right rib after a coughing spell and its been hurting a lot more since that time.  It hurts to move and hurts to breathe.  Hurts when she is coughing.  She had had some wheezing and shortness of breath related to the bronchitis but the symptoms have improved over the last several days.  She denies any new shortness of breath other than it hurts when she breathes so she has been taking shallower breaths.  She denies any leg pain or swelling.  No known fevers.     Past Medical History:  Diagnosis Date  . Dysthymia    Described as mild anxiety and depression  . patient-activated cardiac event monitor 11/2017   Mostly NSR (some brady & tachy) - rates 45-142 bpm.  Rare PVCs (singles - no couplets, runs or bi-trigeminy), No PACs, PAT or arrhythmia such as SVT, A. fib or a flutter.  Marland Kitchen PONV (postoperative nausea and vomiting)     Patient Active Problem List   Diagnosis Date Noted  . PVC's (premature ventricular contractions) 11/29/2017  . Sensation of chest tightness 11/29/2017  . Vaginal delivery 02/27/2016  . Obesity (BMI 30.0-34.9) 02/26/2016  . Anxiety 02/26/2016  . Allergy to penicillin 02/26/2016    Past Surgical History:  Procedure Laterality Date  . TRANSTHORACIC ECHOCARDIOGRAM  11/2017   EF 50 and 55% with no regional Ferreras motion normality.  Normal valves.  No mitral  prolapse.     OB History    Gravida  1   Para  1   Term  1   Preterm      AB      Living  1     SAB      TAB      Ectopic      Multiple  0   Live Births  1            Home Medications    Prior to Admission medications   Medication Sig Start Date End Date Taking? Authorizing Provider  albuterol (PROVENTIL HFA;VENTOLIN HFA) 108 (90 Base) MCG/ACT inhaler Inhale 1-2 puffs into the lungs every 6 (six) hours as needed for wheezing or shortness of breath. 11/27/18   Wieters, Hallie C, PA-C  benzonatate (TESSALON) 100 MG capsule Take 1 capsule (100 mg total) by mouth every 8 (eight) hours. 11/24/18   Cathie Hoops, Amy V, PA-C  cyclobenzaprine (FLEXERIL) 5 MG tablet Take 1-2 tablets (5-10 mg total) by mouth 2 (two) times daily as needed for muscle spasms. 12/01/18   Wieters, Hallie C, PA-C  doxycycline (VIBRAMYCIN) 100 MG capsule Take 1 capsule (100 mg total) by mouth 2 (two) times daily. 11/24/18   Cathie Hoops, Amy V, PA-C  ibuprofen (ADVIL,MOTRIN) 800 MG tablet Take 1 tablet (800 mg total) by mouth 3 (three) times daily. 12/01/18  Wieters, Hallie C, PA-C  ondansetron (ZOFRAN ODT) 4 MG disintegrating tablet 4mg  ODT q4 hours prn nausea/vomit 12/02/18   Rolan Bucco, MD  predniSONE (DELTASONE) 50 MG tablet Take daily for 5 days with food, in the morning 11/27/18   Wieters, Hallie C, PA-C  traMADol (ULTRAM) 50 MG tablet Take 1 tablet (50 mg total) by mouth every 6 (six) hours as needed. 12/02/18   Rolan Bucco, MD    Family History Family History  Adopted: Yes  Problem Relation Age of Onset  . Seizures Mother        She is not aware of any details.  This is her birth mother, and she has limited information  . Skin cancer Maternal Grandfather     Social History Social History   Tobacco Use  . Smoking status: Never Smoker  . Smokeless tobacco: Never Used  Substance Use Topics  . Alcohol use: No    Frequency: Never  . Drug use: No     Allergies   Amoxil [amoxicillin] and  Penicillins   Review of Systems Review of Systems  Constitutional: Negative for chills, diaphoresis, fatigue and fever.  HENT: Negative for congestion, rhinorrhea and sneezing.   Eyes: Negative.   Respiratory: Positive for cough. Negative for chest tightness and shortness of breath.   Cardiovascular: Positive for chest pain (Right rib pain). Negative for leg swelling.  Gastrointestinal: Negative for abdominal pain, blood in stool, diarrhea, nausea and vomiting.  Genitourinary: Negative for difficulty urinating, flank pain, frequency and hematuria.  Musculoskeletal: Negative for arthralgias and back pain.  Skin: Negative for rash.  Neurological: Negative for dizziness, speech difficulty, weakness, numbness and headaches.     Physical Exam Updated Vital Signs BP 121/86   Pulse 100   Temp 98.2 F (36.8 C) (Oral)   Resp 18   Ht 4\' 11"  (1.499 m)   Wt 62.6 kg   SpO2 97%   BMI 27.87 kg/m   Physical Exam Constitutional:      Appearance: She is well-developed.  HENT:     Head: Normocephalic and atraumatic.  Eyes:     Pupils: Pupils are equal, round, and reactive to light.  Neck:     Musculoskeletal: Normal range of motion and neck supple.  Cardiovascular:     Rate and Rhythm: Normal rate and regular rhythm.     Heart sounds: Normal heart sounds.  Pulmonary:     Effort: Pulmonary effort is normal. No respiratory distress.     Breath sounds: Normal breath sounds. No wheezing or rales.  Chest:     Chest Burdick: Tenderness (Positive tenderness to the right lower anterior rib just under her right breast.  There is no crepitus or deformity.) present.  Abdominal:     General: Bowel sounds are normal.     Palpations: Abdomen is soft.     Tenderness: There is no abdominal tenderness. There is no guarding or rebound.  Musculoskeletal: Normal range of motion.     Comments: No edema or calf tenderness  Lymphadenopathy:     Cervical: No cervical adenopathy.  Skin:    General: Skin is  warm and dry.     Findings: No rash.  Neurological:     Mental Status: She is alert and oriented to person, place, and time.      ED Treatments / Results  Labs (all labs ordered are listed, but only abnormal results are displayed) Labs Reviewed - No data to display  EKG None  Radiology Dg Ribs Unilateral  W/chest Right  Result Date: 12/02/2018 CLINICAL DATA:  Cough for the past 2 and half weeks. Right-sided rib pain. EXAM: RIGHT RIBS AND CHEST - 3+ VIEW COMPARISON:  Chest x-ray dated November 27, 2018. FINDINGS: No fracture or other bone lesions are seen involving the ribs. There is no evidence of pneumothorax or pleural effusion. Both lungs are clear. Heart size and mediastinal contours are within normal limits. IMPRESSION: Negative. Electronically Signed   By: Obie Dredge M.D.   On: 12/02/2018 16:07    Procedures Procedures (including critical care time)  Medications Ordered in ED Medications - No data to display   Initial Impression / Assessment and Plan / ED Course  I have reviewed the triage vital signs and the nursing notes.  Pertinent labs & imaging results that were available during my care of the patient were reviewed by me and considered in my medical decision making (see chart for details).        Patient's x-ray shows no evidence of pneumonia, pneumothorax or obvious rib fracture.  Her pain does seem to be musculoskeletal in nature.  Is reproducible on palpation and started after a coughing spell.  I do not see any other suggestions of etiologies such as pulmonary embolus.  She has normal oxygen saturations of 100% on room air.  No persistent tachycardia.  She denies need for any pain medication in the ED although she requested a prescription for something.  She did not do well with Vicodin or oxycodone in the past due to severe nausea.  We will try tramadol and she was also given a prescription for Zofran.  She was advised to use ibuprofen and Tylenol as well for  symptomatic relief.  Return precautions were given.  She was encouraged to follow-up with a primary care physician.  Final Clinical Impressions(s) / ED Diagnoses   Final diagnoses:  Right-sided chest Durley pain    ED Discharge Orders         Ordered    traMADol (ULTRAM) 50 MG tablet  Every 6 hours PRN     12/02/18 1631    ondansetron (ZOFRAN ODT) 4 MG disintegrating tablet     12/02/18 1631           Rolan Bucco, MD 12/02/18 [id number]

## 2018-12-24 NOTE — Progress Notes (Deleted)
   Subjective:    Patient ID: Stephanie Gomez, female    DOB: [birth year], 33 y.o.   MRN: [id number]  HPI: Stephanie Gomez is here to establish as a new pt. She is a pleasant 33 year old female. PMH: Obesity, Anxiety   Patient Care Team    Relationship Specialty Notifications Start End  Sigmund Hazel, MD PCP - General Family Medicine  02/02/12     Patient Active Problem List   Diagnosis Date Noted  . PVC's (premature ventricular contractions) 11/29/2017  . Sensation of chest tightness 11/29/2017  . Vaginal delivery 02/27/2016  . Obesity (BMI 30.0-34.9) 02/26/2016  . Anxiety 02/26/2016  . Allergy to penicillin 02/26/2016     Past Medical History:  Diagnosis Date  . Dysthymia    Described as mild anxiety and depression  . patient-activated cardiac event monitor 11/2017   Mostly NSR (some brady & tachy) - rates 45-142 bpm.  Rare PVCs (singles - no couplets, runs or bi-trigeminy), No PACs, PAT or arrhythmia such as SVT, A. fib or a flutter.  Marland Kitchen PONV (postoperative nausea and vomiting)      Past Surgical History:  Procedure Laterality Date  . TRANSTHORACIC ECHOCARDIOGRAM  11/2017   EF 50 and 55% with no regional Cho motion normality.  Normal valves.  No mitral prolapse.     Family History  Adopted: Yes  Problem Relation Age of Onset  . Seizures Mother        She is not aware of any details.  This is her birth mother, and she has limited information  . Skin cancer Maternal Grandfather      Social History   Substance and Sexual Activity  Drug Use No     Social History   Substance and Sexual Activity  Alcohol Use No  . Frequency: Never     Social History   Tobacco Use  Smoking Status Never Smoker  Smokeless Tobacco Never Used     Outpatient Encounter Medications as of 12/28/2018  Medication Sig  . albuterol (PROVENTIL HFA;VENTOLIN HFA) 108 (90 Base) MCG/ACT inhaler Inhale 1-2 puffs into the lungs every 6 (six) hours as needed for wheezing or shortness of breath.   . benzonatate (TESSALON) 100 MG capsule Take 1 capsule (100 mg total) by mouth every 8 (eight) hours.  . cyclobenzaprine (FLEXERIL) 5 MG tablet Take 1-2 tablets (5-10 mg total) by mouth 2 (two) times daily as needed for muscle spasms.  Marland Kitchen doxycycline (VIBRAMYCIN) 100 MG capsule Take 1 capsule (100 mg total) by mouth 2 (two) times daily.  Marland Kitchen ibuprofen (ADVIL,MOTRIN) 800 MG tablet Take 1 tablet (800 mg total) by mouth 3 (three) times daily.  . ondansetron (ZOFRAN ODT) 4 MG disintegrating tablet 4mg  ODT q4 hours prn nausea/vomit  . predniSONE (DELTASONE) 50 MG tablet Take daily for 5 days with food, in the morning  . traMADol (ULTRAM) 50 MG tablet Take 1 tablet (50 mg total) by mouth every 6 (six) hours as needed.   No facility-administered encounter medications on file as of 12/28/2018.     Allergies: Amoxil [amoxicillin] and Penicillins  There is no height or weight on file to calculate BMI.  unknown if currently breastfeeding.     Review of Systems     Objective:   Physical Exam        Assessment & Plan:  No diagnosis found.  No problem-specific Assessment & Plan notes found for this encounter.    FOLLOW-UP:  No follow-ups on file.

## 2018-12-28 ENCOUNTER — Ambulatory Visit: Payer: 59 | Admitting: Adult Health

## 2019-01-01 ENCOUNTER — Telehealth: Payer: Self-pay | Admitting: Cardiology

## 2019-01-01 NOTE — Telephone Encounter (Signed)
Azithromycin may call palpitation in combinations with other medication but no expected drug-drug interaction noted .  Please take medication as prescribed.

## 2019-01-01 NOTE — Telephone Encounter (Signed)
Called the pt with [facility] Pharm-D response and recommendation. Pt verbalized understanding and voiced appreciation for the assistance.

## 2019-01-01 NOTE — Telephone Encounter (Signed)
° ° °  Patient wants to know if she should be taking the antibiotic Azithromycin with heart conditions

## 2019-01-01 NOTE — Telephone Encounter (Signed)
Returned the pt call. Pt sts that she has recently been prescribed Prednisone, Albuterol, and Azithromycin. Pt would like to know if these medications are safe to use with her "cardiac dx" Confirmed that she was inquiring about her PVCs.. Adv her that Albuterol and Prednisone may cause increased palpitations or tachycardia, I am not sure if Azithromycin does the same. She is currently taking all 3, but wants to know if she should. Adv her that increased palpitations are expected side effects of Pred and Albuterol. Adv her that I will fwd the message to our Pharm-D to advise and we will give her a call back with her response. Pt verbalized understanding.

## 2019-02-28 NOTE — Progress Notes (Signed)
Virtual Visit via Video Note  I connected with Stephanie Gomez on 03/01/2019 at 11:15 AM EDT by a video enabled telemedicine application and verified that I am speaking with the correct person using two identifiers.  Location: Patient: Home  Provider: In Clinic   I discussed the limitations of evaluation and management by telemedicine and the availability of in person appointments. The patient expressed understanding and agreed to proceed.  History of Present Illness: Ms. Stephanie Gomez connects via WebEx today to establish as a new pt. She is a pleasant 33 year old female. PMH: Hx of palpitations/PVC Fall 2018-Winter 2019 Seen by Dr. Harding/Cards 12/08/2017 Echo: Essentially normal EF 50-55% 12/08/2017 Cardiac Event Monitor:  Mostly sinus rhythm. Rare intermittent PVCs  No PACs, PAT or arrhythmia such as SVT, A. fib or a flutter.  PVCs and single with no couplets, bigeminy, trigeminy or runs. Recommendation- avoid beta blocker, use biofeedback technique to reduce palpitations. F/u with cards PRN. She has had lingering productive cough since 11/2018- multiple ED/IC visits, CXR all normal She has used Albuterol inhaler "only a handful of times" months ago, now using daily OTX Zyrtec.  She reports wheezing when she skips a dose. She has never been dx'd with Asthma or evaluated by Pulmonology. She denies tobacco/vape use, social ETOH use She estimates to drink >100 oz water/day, follows heart healthy diet. 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  Patient Care Team    Relationship Specialty Notifications Start End  William Hamburgeranford, Ioma Chismar D, NP PCP - General Family Medicine  02/22/19   Sigmund HazelMiller, Lisa, MD Consulting Physician Family Medicine  02/22/19     Patient Active Problem List   Diagnosis Date Noted  . PVC's (premature ventricular contractions) 11/29/2017  . Sensation of chest tightness 11/29/2017  . Vaginal delivery 02/27/2016  . Obesity (BMI 30.0-34.9) 02/26/2016  .  Anxiety 02/26/2016  . Allergy to penicillin 02/26/2016     Past Medical History:  Diagnosis Date  . Dysthymia    Described as mild anxiety and depression  . patient-activated cardiac event monitor 11/2017   Mostly NSR (some brady & tachy) - rates 45-142 bpm.  Rare PVCs (singles - no couplets, runs or bi-trigeminy), No PACs, PAT or arrhythmia such as SVT, A. fib or a flutter.  Marland Kitchen. PONV (postoperative nausea and vomiting)      Past Surgical History:  Procedure Laterality Date  . TRANSTHORACIC ECHOCARDIOGRAM  11/2017   EF 50 and 55% with no regional [place] motion normality.  Normal valves.  No mitral prolapse.     Family History  Adopted: Yes  Problem Relation Age of Onset  . Seizures Mother        She is not aware of any details.  This is her birth mother, and she has limited information  . Skin cancer Maternal Grandfather      Social History   Substance and Sexual Activity  Drug Use No     Social History   Substance and Sexual Activity  Alcohol Use No  . Frequency: Never     Social History   Tobacco Use  Smoking Status Never Smoker  Smokeless Tobacco Never Used     Outpatient Encounter Medications as of 03/01/2019  Medication Sig  . albuterol (PROVENTIL HFA;VENTOLIN HFA) 108 (90 Base) MCG/ACT inhaler Inhale 1-2 puffs into the lungs every 6 (six) hours as needed for wheezing or shortness of breath.  . benzonatate (TESSALON) 100 MG capsule Take 1 capsule (100 mg total) by  mouth every 8 (eight) hours.  . cyclobenzaprine (FLEXERIL) 5 MG tablet Take 1-2 tablets (5-10 mg total) by mouth 2 (two) times daily as needed for muscle spasms.  Marland Kitchen doxycycline (VIBRAMYCIN) 100 MG capsule Take 1 capsule (100 mg total) by mouth 2 (two) times daily.  Marland Kitchen ibuprofen (ADVIL,MOTRIN) 800 MG tablet Take 1 tablet (800 mg total) by mouth 3 (three) times daily.  . ondansetron (ZOFRAN ODT) 4 MG disintegrating tablet 4mg  ODT q4 hours prn nausea/vomit  . predniSONE (DELTASONE) 50 MG tablet Take  daily for 5 days with food, in the morning  . traMADol (ULTRAM) 50 MG tablet Take 1 tablet (50 mg total) by mouth every 6 (six) hours as needed.   No facility-administered encounter medications on file as of 03/01/2019.     Allergies: Amoxil [amoxicillin] and Penicillins  There is no height or weight on file to calculate BMI.  unknown if currently breastfeeding. Review of Systems: General:   Denies fever, chills, unexplained weight loss.  Optho/Auditory:   Denies visual changes, blurred vision/LOV Respiratory:   Denies SOB, DOE more than baseline levels. Productive Cough +  Cardiovascular:   Denies chest pain, palpitations, new onset peripheral edema  Gastrointestinal:   Denies nausea, vomiting, diarrhea.  Genitourinary: Denies dysuria, freq/ urgency, flank pain or discharge from genitals.  Endocrine:     Denies hot or cold intolerance, polyuria, polydipsia. Musculoskeletal:   Denies unexplained myalgias, joint swelling, unexplained arthralgias, gait problems.  Skin:  Denies rash, suspicious lesions Neurological:     Denies dizziness, unexplained weakness, numbness  Psychiatric/Behavioral:   Denies mood changes, suicidal or homicidal ideations, hallucinations This patient does not have sx concerning for COVID-19 Infection (ie; fever, chills, cough, new or worsening shortness of breath).  Observations/Objective: No acute distress noted during the WebEx video conference  Assessment and Plan Remain well hydrated, follow Mediterranean diet Continue with OTC Zyrtec as needed, if that begins to fail to treat sx's- will consider starting Montelukast (Singular)  COVID-19 Education: Signs and symptoms of COVID-19 infection were discussed with pt and how to seek care for testing.  The importance of following the Stay at Home order, and when out- Social Distancing and wearing a facial mask were discussed today. Follow Up Instructions: 2 months CPE, fasting labs the week prior.   I  discussed the assessment and treatment plan with the patient. The patient was provided an opportunity to ask questions and all were answered. The patient agreed with the plan and demonstrated an understanding of the instructions.   The patient was advised to call back or seek an in-person evaluation if the symptoms worsen or if the condition fails to improve as anticipated.  I provided 25 minutes of non-face-to-face time during this encounter.   Julaine Fusi, NP

## 2019-03-01 ENCOUNTER — Other Ambulatory Visit: Payer: Self-pay

## 2019-03-01 ENCOUNTER — Ambulatory Visit (INDEPENDENT_AMBULATORY_CARE_PROVIDER_SITE_OTHER): Payer: 59 | Admitting: Adult Health

## 2019-03-01 ENCOUNTER — Encounter: Payer: Self-pay | Admitting: Adult Health

## 2019-03-01 VITALS — Temp 98.4°F | Ht 59.5 in | Wt 135.0 lb

## 2019-03-01 DIAGNOSIS — I493 Ventricular premature depolarization: Secondary | ICD-10-CM

## 2019-03-01 DIAGNOSIS — Z0000 Encounter for general adult medical examination without abnormal findings: Secondary | ICD-10-CM | POA: Insufficient documentation

## 2019-03-01 NOTE — Assessment & Plan Note (Signed)
Seen by Dr. Cathleen Corti 12/08/2017 Echo: Essentially normal EF 50-55% 12/08/2017 Cardiac Event Monitor:  Mostly sinus rhythm. Rare intermittent PVCs  No PACs, PAT or arrhythmia such as SVT, A. fib or a flutter.  PVCs and single with no couplets, bigeminy, trigeminy or runs. Recommendation- avoid beta blocker, use biofeedback technique to reduce palpitations. F/u with cards PRN.

## 2019-03-01 NOTE — Assessment & Plan Note (Signed)
Assessment and Plan Remain well hydrated, follow Mediterranean diet Continue with OTC Zyrtec as needed, if that begins to fail to treat sx's- will consider starting Montelukast (Singular)  COVID-19 Education: Signs and symptoms of COVID-19 infection were discussed with pt and how to seek care for testing.  The importance of following the Stay at Home order, and when out- Social Distancing and wearing a facial mask were discussed today. Follow Up Instructions: 2 months CPE, fasting labs the week prior.   I discussed the assessment and treatment plan with the patient. The patient was provided an opportunity to ask questions and all were answered. The patient agreed with the plan and demonstrated an understanding of the instructions.   The patient was advised to call back or seek an in-person evaluation if the symptoms worsen or if the condition fails to improve as anticipated.

## 2019-04-26 ENCOUNTER — Other Ambulatory Visit: Payer: 59

## 2019-05-01 ENCOUNTER — Other Ambulatory Visit: Payer: 59

## 2019-05-01 ENCOUNTER — Other Ambulatory Visit: Payer: Self-pay

## 2019-05-01 DIAGNOSIS — Z20822 Contact with and (suspected) exposure to covid-19: Secondary | ICD-10-CM

## 2019-05-01 DIAGNOSIS — Z0000 Encounter for general adult medical examination without abnormal findings: Secondary | ICD-10-CM

## 2019-05-01 NOTE — Addendum Note (Signed)
Addended by: Fonnie Mu on: 05/01/2019 08:26 AM   Modules accepted: Orders

## 2019-05-02 LAB — CBC WITH DIFFERENTIAL/PLATELET
Basophils Absolute: 0 10*3/uL (ref 0.0–0.2)
Basos: 0 %
EOS (ABSOLUTE): 0.2 10*3/uL (ref 0.0–0.4)
Eos: 3 %
Hematocrit: 38.9 % (ref 34.0–46.6)
Hemoglobin: 13 g/dL (ref 11.1–15.9)
Immature Grans (Abs): 0 10*3/uL (ref 0.0–0.1)
Immature Granulocytes: 0 %
Lymphocytes Absolute: 2.7 10*3/uL (ref 0.7–3.1)
Lymphs: 35 %
MCH: 31.4 pg (ref 26.6–33.0)
MCHC: 33.4 g/dL (ref 31.5–35.7)
MCV: 94 fL (ref 79–97)
Monocytes Absolute: 0.6 10*3/uL (ref 0.1–0.9)
Monocytes: 7 %
Neutrophils Absolute: 4.2 10*3/uL (ref 1.4–7.0)
Neutrophils: 55 %
Platelets: 286 10*3/uL (ref 150–450)
RBC: 4.14 x10E6/uL (ref 3.77–5.28)
RDW: 12.2 % (ref 11.7–15.4)
WBC: 7.7 10*3/uL (ref 3.4–10.8)

## 2019-05-02 LAB — COMPREHENSIVE METABOLIC PANEL
ALT: 15 IU/L (ref 0–32)
AST: 13 IU/L (ref 0–40)
Albumin/Globulin Ratio: 2.1 (ref 1.2–2.2)
Albumin: 4.6 g/dL (ref 3.8–4.8)
Alkaline Phosphatase: 54 IU/L (ref 39–117)
BUN/Creatinine Ratio: 11 (ref 9–23)
BUN: 9 mg/dL (ref 6–20)
Bilirubin Total: 0.6 mg/dL (ref 0.0–1.2)
CO2: 21 mmol/L (ref 20–29)
Calcium: 9.3 mg/dL (ref 8.7–10.2)
Chloride: 102 mmol/L (ref 96–106)
Creatinine, Ser: 0.81 mg/dL (ref 0.57–1.00)
GFR calc Af Amer: 112 mL/min/{1.73_m2} (ref >59)
GFR calc non Af Amer: 97 mL/min/{1.73_m2} (ref >59)
Globulin, Total: 2.2 g/dL (ref 1.5–4.5)
Glucose: 84 mg/dL (ref 65–99)
Potassium: 4.9 mmol/L (ref 3.5–5.2)
Sodium: 138 mmol/L (ref 134–144)
Total Protein: 6.8 g/dL (ref 6.0–8.5)

## 2019-05-02 LAB — LIPID PANEL
Chol/HDL Ratio: 2 ratio (ref 0.0–4.4)
Cholesterol, Total: 134 mg/dL (ref 100–199)
HDL: 67 mg/dL (ref >39)
LDL Calculated: 55 mg/dL (ref 0–99)
Triglycerides: 60 mg/dL (ref 0–149)
VLDL Cholesterol Cal: 12 mg/dL (ref 5–40)

## 2019-05-02 LAB — HEMOGLOBIN A1C
Est. average glucose Bld gHb Est-mCnc: 94 mg/dL
Hgb A1c MFr Bld: 4.9 % (ref 4.8–5.6)

## 2019-05-02 LAB — TSH: TSH: 1.96 u[IU]/mL (ref 0.450–4.500)

## 2019-05-02 LAB — SARS-COV-2 ANTIBODY, IGM: SARS-CoV-2 Antibody, IgM: NEGATIVE

## 2019-05-08 ENCOUNTER — Ambulatory Visit (INDEPENDENT_AMBULATORY_CARE_PROVIDER_SITE_OTHER): Payer: 59 | Admitting: Adult Health

## 2019-05-08 ENCOUNTER — Encounter: Payer: Self-pay | Admitting: Adult Health

## 2019-05-08 ENCOUNTER — Other Ambulatory Visit: Payer: Self-pay

## 2019-05-08 DIAGNOSIS — Z0000 Encounter for general adult medical examination without abnormal findings: Secondary | ICD-10-CM

## 2019-05-08 NOTE — Assessment & Plan Note (Signed)
Overall you are doing a GREAT JOB taking care of yourself! Your labs, weight, and blood pressure- fantastic! Remain well hydrated, continue to eat healthy and continue regular exercise. If your palpitations re-occur/worsen-please call Dr. Harding/Cardiology. If you would like a referral to psychology- please call the clinic. Continue to social distance and wear a mask when in clinic. Recommend annual physical, fasting labs the week before.

## 2019-05-08 NOTE — Patient Instructions (Addendum)
Preventive Care for Adults, Female  A healthy lifestyle and preventive care can promote health and wellness. Preventive health guidelines for women include the following key practices.   A routine yearly physical is a good way to check with your health care provider about your health and preventive screening. It is a chance to share any concerns and updates on your health and to receive a thorough exam.   Visit your dentist for a routine exam and preventive care every 6 months. Brush your teeth twice a day and floss once a day. Good oral hygiene prevents tooth decay and gum disease.   The frequency of eye exams is based on your age, health, family medical history, use of contact lenses, and other factors. Follow your health care provider's recommendations for frequency of eye exams.   Eat a healthy diet. Foods like vegetables, fruits, whole grains, low-fat dairy products, and lean protein foods contain the nutrients you need without too many calories. Decrease your intake of foods high in solid fats, added sugars, and salt. Eat the right amount of calories for you.Get information about a proper diet from your health care provider, if necessary.   Regular physical exercise is one of the most important things you can do for your health. Most adults should get at least 150 minutes of moderate-intensity exercise (any activity that increases your heart rate and causes you to sweat) each week. In addition, most adults need muscle-strengthening exercises on 2 or more days a week.   Maintain a healthy weight. The body mass index (BMI) is a screening tool to identify possible weight problems. It provides an estimate of body fat based on height and weight. Your health care provider can find your BMI, and can help you achieve or maintain a healthy weight.For adults 20 years and older:   - A BMI below 18.5 is considered underweight.   - A BMI of 18.5 to 24.9 is normal.   - A BMI of 25 to 29.9 is  considered overweight.   - A BMI of 30 and above is considered obese.   Maintain normal blood lipids and cholesterol levels by exercising and minimizing your intake of trans and saturated fats.  Eat a balanced diet with plenty of fruit and vegetables. Blood tests for lipids and cholesterol should begin at age 20 and be repeated every 5 years minimum.  If your lipid or cholesterol levels are high, you are over 40, or you are at high risk for heart disease, you may need your cholesterol levels checked more frequently.Ongoing high lipid and cholesterol levels should be treated with medicines if diet and exercise are not working.   If you smoke, find out from your health care provider how to quit. If you do not use tobacco, do not start.   Lung cancer screening is recommended for adults aged 55-80 years who are at high risk for developing lung cancer because of a history of smoking. A yearly low-dose CT scan of the lungs is recommended for people who have at least a 30-pack-year history of smoking and are a current smoker or have quit within the past 15 years. A pack year of smoking is smoking an average of 1 pack of cigarettes a day for 1 year (for example: 1 pack a day for 30 years or 2 packs a day for 15 years). Yearly screening should continue until the smoker has stopped smoking for at least 15 years. Yearly screening should be stopped for people who develop a   health problem that would prevent them from having lung cancer treatment.   If you are pregnant, do not drink alcohol. If you are breastfeeding, be very cautious about drinking alcohol. If you are not pregnant and choose to drink alcohol, do not have more than 1 drink per day. One drink is considered to be 12 ounces (355 mL) of beer, 5 ounces (148 mL) of wine, or 1.5 ounces (44 mL) of liquor.   Avoid use of street drugs. Do not share needles with anyone. Ask for help if you need support or instructions about stopping the use of  drugs.   High blood pressure causes heart disease and increases the risk of stroke. Your blood pressure should be checked at least yearly.  Ongoing high blood pressure should be treated with medicines if weight loss and exercise do not work.   If you are 69-55 years old, ask your health care provider if you should take aspirin to prevent strokes.   Diabetes screening involves taking a blood sample to check your fasting blood sugar level. This should be done once every 3 years, after age 38, if you are within normal weight and without risk factors for diabetes. Testing should be considered at a younger age or be carried out more frequently if you are overweight and have at least 1 risk factor for diabetes.   Breast cancer screening is essential preventive care for women. You should practice "breast self-awareness."  This means understanding the normal appearance and feel of your breasts and may include breast self-examination.  Any changes detected, no matter how small, should be reported to a health care provider.  Women in their 80s and 30s should have a clinical breast exam (CBE) by a health care provider as part of a regular health exam every 1 to 3 years.  After age 66, women should have a CBE every year.  Starting at age 1, women should consider having a mammogram (breast X-ray test) every year.  Women who have a family history of breast cancer should talk to their health care provider about genetic screening.  Women at a high risk of breast cancer should talk to their health care providers about having an MRI and a mammogram every year.   -Breast cancer gene (BRCA)-related cancer risk assessment is recommended for women who have family members with BRCA-related cancers. BRCA-related cancers include breast, ovarian, tubal, and peritoneal cancers. Having family members with these cancers may be associated with an increased risk for harmful changes (mutations) in the breast cancer genes BRCA1 and  BRCA2. Results of the assessment will determine the need for genetic counseling and BRCA1 and BRCA2 testing.   The Pap test is a screening test for cervical cancer. A Pap test can show cell changes on the cervix that might become cervical cancer if left untreated. A Pap test is a procedure in which cells are obtained and examined from the lower end of the uterus (cervix).   - Women should have a Pap test starting at age 57.   - Between ages 90 and 70, Pap tests should be repeated every 2 years.   - Beginning at age 63, you should have a Pap test every 3 years as long as the past 3 Pap tests have been normal.   - Some women have medical problems that increase the chance of getting cervical cancer. Talk to your health care provider about these problems. It is especially important to talk to your health care provider if a  new problem develops soon after your last Pap test. In these cases, your health care provider may recommend more frequent screening and Pap tests.   - The above recommendations are the same for women who have or have not gotten the vaccine for human papillomavirus (HPV).   - If you had a hysterectomy for a problem that was not cancer or a condition that could lead to cancer, then you no longer need Pap tests. Even if you no longer need a Pap test, a regular exam is a good idea to make sure no other problems are starting.   - If you are between ages 36 and 66 years, and you have had normal Pap tests going back 10 years, you no longer need Pap tests. Even if you no longer need a Pap test, a regular exam is a good idea to make sure no other problems are starting.   - If you have had past treatment for cervical cancer or a condition that could lead to cancer, you need Pap tests and screening for cancer for at least 20 years after your treatment.   - If Pap tests have been discontinued, risk factors (such as a new sexual partner) need to be reassessed to determine if screening should  be resumed.   - The HPV test is an additional test that may be used for cervical cancer screening. The HPV test looks for the virus that can cause the cell changes on the cervix. The cells collected during the Pap test can be tested for HPV. The HPV test could be used to screen women aged 70 years and older, and should be used in women of any age who have unclear Pap test results. After the age of 67, women should have HPV testing at the same frequency as a Pap test.   Colorectal cancer can be detected and often prevented. Most routine colorectal cancer screening begins at the age of 57 years and continues through age 26 years. However, your health care provider may recommend screening at an earlier age if you have risk factors for colon cancer. On a yearly basis, your health care provider may provide home test kits to check for hidden blood in the stool.  Use of a small camera at the end of a tube, to directly examine the colon (sigmoidoscopy or colonoscopy), can detect the earliest forms of colorectal cancer. Talk to your health care provider about this at age 23, when routine screening begins. Direct exam of the colon should be repeated every 5 -10 years through age 49 years, unless early forms of pre-cancerous polyps or small growths are found.   People who are at an increased risk for hepatitis B should be screened for this virus. You are considered at high risk for hepatitis B if:  -You were born in a country where hepatitis B occurs often. Talk with your health care provider about which countries are considered high risk.  - Your parents were born in a high-risk country and you have not received a shot to protect against hepatitis B (hepatitis B vaccine).  - You have HIV or AIDS.  - You use needles to inject street drugs.  - You live with, or have sex with, someone who has Hepatitis B.  - You get hemodialysis treatment.  - You take certain medicines for conditions like cancer, organ  transplantation, and autoimmune conditions.   Hepatitis C blood testing is recommended for all people born from [birth year] through 1965 and any individual  with known risks for hepatitis C.   Practice safe sex. Use condoms and avoid high-risk sexual practices to reduce the spread of sexually transmitted infections (STIs). STIs include gonorrhea, chlamydia, syphilis, trichomonas, herpes, HPV, and human immunodeficiency virus (HIV). Herpes, HIV, and HPV are viral illnesses that have no cure. They can result in disability, cancer, and death. Sexually active women aged 25 years and younger should be checked for chlamydia. Older women with new or multiple partners should also be tested for chlamydia. Testing for other STIs is recommended if you are sexually active and at increased risk.   Osteoporosis is a disease in which the bones lose minerals and strength with aging. This can result in serious bone fractures or breaks. The risk of osteoporosis can be identified using a bone density scan. Women ages 65 years and over and women at risk for fractures or osteoporosis should discuss screening with their health care providers. Ask your health care provider whether you should take a calcium supplement or vitamin D to There are also several preventive steps women can take to avoid osteoporosis and resulting fractures or to keep osteoporosis from worsening. -->Recommendations include:  Eat a balanced diet high in fruits, vegetables, calcium, and vitamins.  Get enough calcium. The recommended total intake of is 1,200 mg daily; for best absorption, if taking supplements, divide doses into 250-500 mg doses throughout the day. Of the two types of calcium, calcium carbonate is best absorbed when taken with food but calcium citrate can be taken on an empty stomach.  Get enough vitamin D. NAMS and the National Osteoporosis Foundation recommend at least 1,000 IU per day for women age 50 and over who are at risk of vitamin D  deficiency. Vitamin D deficiency can be caused by inadequate sun exposure (for example, those who live in northern latitudes).  Avoid alcohol and smoking. Heavy alcohol intake (more than 7 drinks per week) increases the risk of falls and hip fracture and women smokers tend to lose bone more rapidly and have lower bone mass than nonsmokers. Stopping smoking is one of the most important changes women can make to improve their health and decrease risk for disease.  Be physically active every day. Weight-bearing exercise (for example, fast walking, hiking, jogging, and weight training) may strengthen bones or slow the rate of bone loss that comes with aging. Balancing and muscle-strengthening exercises can reduce the risk of falling and fracture.  Consider therapeutic medications. Currently, several types of effective drugs are available. Healthcare providers can recommend the type most appropriate for each woman.  Eliminate environmental factors that may contribute to accidents. Falls cause nearly 90% of all osteoporotic fractures, so reducing this risk is an important bone-health strategy. Measures include ample lighting, removing obstructions to walking, using nonskid rugs on floors, and placing mats and/or grab bars in showers.  Be aware of medication side effects. Some common medicines make bones weaker. These include a type of steroid drug called glucocorticoids used for arthritis and asthma, some antiseizure drugs, certain sleeping pills, treatments for endometriosis, and some cancer drugs. An overactive thyroid gland or using too much thyroid hormone for an underactive thyroid can also be a problem. If you are taking these medicines, talk to your doctor about what you can do to help protect your bones.reduce the rate of osteoporosis.    Menopause can be associated with physical symptoms and risks. Hormone replacement therapy is available to decrease symptoms and risks. You should talk to your  health care provider   about whether hormone replacement therapy is right for you.   Use sunscreen. Apply sunscreen liberally and repeatedly throughout the day. You should seek shade when your shadow is shorter than you. Protect yourself by wearing long sleeves, pants, a wide-brimmed hat, and sunglasses year round, whenever you are outdoors.   Once a month, do a whole body skin exam, using a mirror to look at the skin on your back. Tell your health care provider of new moles, moles that have irregular borders, moles that are larger than a pencil eraser, or moles that have changed in shape or color.   -Stay current with required vaccines (immunizations).   Influenza vaccine. All adults should be immunized every year.  Tetanus, diphtheria, and acellular pertussis (Td, Tdap) vaccine. Pregnant women should receive 1 dose of Tdap vaccine during each pregnancy. The dose should be obtained regardless of the length of time since the last dose. Immunization is preferred during the 27th 36th week of gestation. An adult who has not previously received Tdap or who does not know her vaccine status should receive 1 dose of Tdap. This initial dose should be followed by tetanus and diphtheria toxoids (Td) booster doses every 10 years. Adults with an unknown or incomplete history of completing a 3-dose immunization series with Td-containing vaccines should begin or complete a primary immunization series including a Tdap dose. Adults should receive a Td booster every 10 years.  Varicella vaccine. An adult without evidence of immunity to varicella should receive 2 doses or a second dose if she has previously received 1 dose. Pregnant females who do not have evidence of immunity should receive the first dose after pregnancy. This first dose should be obtained before leaving the health care facility. The second dose should be obtained 4 8 weeks after the first dose.  Human papillomavirus (HPV) vaccine. Females aged 13 26  years who have not received the vaccine previously should obtain the 3-dose series. The vaccine is not recommended for use in pregnant females. However, pregnancy testing is not needed before receiving a dose. If a female is found to be pregnant after receiving a dose, no treatment is needed. In that case, the remaining doses should be delayed until after the pregnancy. Immunization is recommended for any person with an immunocompromised condition through the age of 26 years if she did not get any or all doses earlier. During the 3-dose series, the second dose should be obtained 4 8 weeks after the first dose. The third dose should be obtained 24 weeks after the first dose and 16 weeks after the second dose.  Zoster vaccine. One dose is recommended for adults aged 60 years or older unless certain conditions are present.  Measles, mumps, and rubella (MMR) vaccine. Adults born before 1957 generally are considered immune to measles and mumps. Adults born in 1957 or later should have 1 or more doses of MMR vaccine unless there is a contraindication to the vaccine or there is laboratory evidence of immunity to each of the three diseases. A routine second dose of MMR vaccine should be obtained at least 28 days after the first dose for students attending postsecondary schools, health care workers, or international travelers. People who received inactivated measles vaccine or an unknown type of measles vaccine during 1963 1967 should receive 2 doses of MMR vaccine. People who received inactivated mumps vaccine or an unknown type of mumps vaccine before 1979 and are at high risk for mumps infection should consider immunization with 2 doses of   MMR vaccine. For females of childbearing age, rubella immunity should be determined. If there is no evidence of immunity, females who are not pregnant should be vaccinated. If there is no evidence of immunity, females who are pregnant should delay immunization until after pregnancy.  Unvaccinated health care workers born before [birth year] who lack laboratory evidence of measles, mumps, or rubella immunity or laboratory confirmation of disease should consider measles and mumps immunization with 2 doses of MMR vaccine or rubella immunization with 1 dose of MMR vaccine.  Pneumococcal 13-valent conjugate (PCV13) vaccine. When indicated, a person who is uncertain of her immunization history and has no record of immunization should receive the PCV13 vaccine. An adult aged 54 years or older who has certain medical conditions and has not been previously immunized should receive 1 dose of PCV13 vaccine. This PCV13 should be followed with a dose of pneumococcal polysaccharide (PPSV23) vaccine. The PPSV23 vaccine dose should be obtained at least 8 weeks after the dose of PCV13 vaccine. An adult aged 58 years or older who has certain medical conditions and previously received 1 or more doses of PPSV23 vaccine should receive 1 dose of PCV13. The PCV13 vaccine dose should be obtained 1 or more years after the last PPSV23 vaccine dose.  Pneumococcal polysaccharide (PPSV23) vaccine. When PCV13 is also indicated, PCV13 should be obtained first. All adults aged 58 years and older should be immunized. An adult younger than age 65 years who has certain medical conditions should be immunized. Any person who resides in a nursing home or long-term care facility should be immunized. An adult smoker should be immunized. People with an immunocompromised condition and certain other conditions should receive both PCV13 and PPSV23 vaccines. People with human immunodeficiency virus (HIV) infection should be immunized as soon as possible after diagnosis. Immunization during chemotherapy or radiation therapy should be avoided. Routine use of PPSV23 vaccine is not recommended for American Indians, [place] Natives, or people younger than 65 years unless there are medical conditions that require PPSV23 vaccine. When indicated,  people who have unknown immunization and have no record of immunization should receive PPSV23 vaccine. One-time revaccination 5 years after the first dose of PPSV23 is recommended for people aged 70 64 years who have chronic kidney failure, nephrotic syndrome, asplenia, or immunocompromised conditions. People who received 1 2 doses of PPSV23 before age 32 years should receive another dose of PPSV23 vaccine at age 96 years or later if at least 5 years have passed since the previous dose. Doses of PPSV23 are not needed for people immunized with PPSV23 at or after age 55 years.  Meningococcal vaccine. Adults with asplenia or persistent complement component deficiencies should receive 2 doses of quadrivalent meningococcal conjugate (MenACWY-D) vaccine. The doses should be obtained at least 2 months apart. Microbiologists working with certain meningococcal bacteria, [employer] recruits, people at risk during an outbreak, and people who travel to or live in countries with a high rate of meningitis should be immunized. A first-year college student up through age 58 years who is living in a residence hall should receive a dose if she did not receive a dose on or after her 16th birthday. Adults who have certain high-risk conditions should receive one or more doses of vaccine.  Hepatitis A vaccine. Adults who wish to be protected from this disease, have certain high-risk conditions, work with hepatitis A-infected animals, work in hepatitis A research labs, or travel to or work in countries with a high rate of hepatitis A should be  immunized. Adults who were previously unvaccinated and who anticipate close contact with an international adoptee during the first 60 days after arrival in the [place] from a country with a high rate of hepatitis A should be immunized.  Hepatitis B vaccine.  Adults who wish to be protected from this disease, have certain high-risk conditions, may be exposed to blood or other infectious  body fluids, are household contacts or sex partners of hepatitis B positive people, are clients or workers in certain care facilities, or travel to or work in countries with a high rate of hepatitis B should be immunized.  Haemophilus influenzae type b (Hib) vaccine. A previously unvaccinated person with asplenia or sickle cell disease or having a scheduled splenectomy should receive 1 dose of Hib vaccine. Regardless of previous immunization, a recipient of a hematopoietic stem cell transplant should receive a 3-dose series 6 12 months after her successful transplant. Hib vaccine is not recommended for adults with HIV infection.  Preventive Services / Frequency Ages 6 to 39years  Blood pressure check.** / Every 1 to 2 years.  Lipid and cholesterol check.** / Every 5 years beginning at age 39.  Clinical breast exam.** / Every 3 years for women in their 61s and 62s.  BRCA-related cancer risk assessment.** / For women who have family members with a BRCA-related cancer (breast, ovarian, tubal, or peritoneal cancers).  Pap test.** / Every 2 years from ages 47 through 85. Every 3 years starting at age 34 through age 12 or 74 with a history of 3 consecutive normal Pap tests.  HPV screening.** / Every 3 years from ages 46 through ages 43 to 54 with a history of 3 consecutive normal Pap tests.  Hepatitis C blood test.** / For any individual with known risks for hepatitis C.  Skin self-exam. / Monthly.  Influenza vaccine. / Every year.  Tetanus, diphtheria, and acellular pertussis (Tdap, Td) vaccine.** / Consult your health care provider. Pregnant women should receive 1 dose of Tdap vaccine during each pregnancy. 1 dose of Td every 10 years.  Varicella vaccine.** / Consult your health care provider. Pregnant females who do not have evidence of immunity should receive the first dose after pregnancy.  HPV vaccine. / 3 doses over 6 months, if 64 and younger. The vaccine is not recommended for use in  pregnant females. However, pregnancy testing is not needed before receiving a dose.  Measles, mumps, rubella (MMR) vaccine.** / You need at least 1 dose of MMR if you were born in 1957 or later. You may also need a 2nd dose. For females of childbearing age, rubella immunity should be determined. If there is no evidence of immunity, females who are not pregnant should be vaccinated. If there is no evidence of immunity, females who are pregnant should delay immunization until after pregnancy.  Pneumococcal 13-valent conjugate (PCV13) vaccine.** / Consult your health care provider.  Pneumococcal polysaccharide (PPSV23) vaccine.** / 1 to 2 doses if you smoke cigarettes or if you have certain conditions.  Meningococcal vaccine.** / 1 dose if you are age 71 to 37 years and a Market researcher living in a residence hall, or have one of several medical conditions, you need to get vaccinated against meningococcal disease. You may also need additional booster doses.  Hepatitis A vaccine.** / Consult your health care provider.  Hepatitis B vaccine.** / Consult your health care provider.  Haemophilus influenzae type b (Hib) vaccine.** / Consult your health care provider.  Ages 55 to 64years  Blood pressure check.** / Every 1 to 2 years.  Lipid and cholesterol check.** / Every 5 years beginning at age 20 years.  Lung cancer screening. / Every year if you are aged 55 80 years and have a 30-pack-year history of smoking and currently smoke or have quit within the past 15 years. Yearly screening is stopped once you have quit smoking for at least 15 years or develop a health problem that would prevent you from having lung cancer treatment.  Clinical breast exam.** / Every year after age 40 years.  BRCA-related cancer risk assessment.** / For women who have family members with a BRCA-related cancer (breast, ovarian, tubal, or peritoneal cancers).  Mammogram.** / Every year beginning at age 40  years and continuing for as long as you are in good health. Consult with your health care provider.  Pap test.** / Every 3 years starting at age 30 years through age 65 or 70 years with a history of 3 consecutive normal Pap tests.  HPV screening.** / Every 3 years from ages 30 years through ages 65 to 70 years with a history of 3 consecutive normal Pap tests.  Fecal occult blood test (FOBT) of stool. / Every year beginning at age 50 years and continuing until age 75 years. You may not need to do this test if you get a colonoscopy every 10 years.  Flexible sigmoidoscopy or colonoscopy.** / Every 5 years for a flexible sigmoidoscopy or every 10 years for a colonoscopy beginning at age 50 years and continuing until age 75 years.  Hepatitis C blood test.** / For all people born from 1945 through 1965 and any individual with known risks for hepatitis C.  Skin self-exam. / Monthly.  Influenza vaccine. / Every year.  Tetanus, diphtheria, and acellular pertussis (Tdap/Td) vaccine.** / Consult your health care provider. Pregnant women should receive 1 dose of Tdap vaccine during each pregnancy. 1 dose of Td every 10 years.  Varicella vaccine.** / Consult your health care provider. Pregnant females who do not have evidence of immunity should receive the first dose after pregnancy.  Zoster vaccine.** / 1 dose for adults aged 60 years or older.  Measles, mumps, rubella (MMR) vaccine.** / You need at least 1 dose of MMR if you were born in 1957 or later. You may also need a 2nd dose. For females of childbearing age, rubella immunity should be determined. If there is no evidence of immunity, females who are not pregnant should be vaccinated. If there is no evidence of immunity, females who are pregnant should delay immunization until after pregnancy.  Pneumococcal 13-valent conjugate (PCV13) vaccine.** / Consult your health care provider.  Pneumococcal polysaccharide (PPSV23) vaccine.** / 1 to 2 doses if  you smoke cigarettes or if you have certain conditions.  Meningococcal vaccine.** / Consult your health care provider.  Hepatitis A vaccine.** / Consult your health care provider.  Hepatitis B vaccine.** / Consult your health care provider.  Haemophilus influenzae type b (Hib) vaccine.** / Consult your health care provider.  Ages 65 years and over  Blood pressure check.** / Every 1 to 2 years.  Lipid and cholesterol check.** / Every 5 years beginning at age 20 years.  Lung cancer screening. / Every year if you are aged 55 80 years and have a 30-pack-year history of smoking and currently smoke or have quit within the past 15 years. Yearly screening is stopped once you have quit smoking for at least 15 years or develop a health problem that   would prevent you from having lung cancer treatment.  Clinical breast exam.** / Every year after age 103 years.  BRCA-related cancer risk assessment.** / For women who have family members with a BRCA-related cancer (breast, ovarian, tubal, or peritoneal cancers).  Mammogram.** / Every year beginning at age 36 years and continuing for as long as you are in good health. Consult with your health care provider.  Pap test.** / Every 3 years starting at age 5 years through age 85 or 10 years with 3 consecutive normal Pap tests. Testing can be stopped between 65 and 70 years with 3 consecutive normal Pap tests and no abnormal Pap or HPV tests in the past 10 years.  HPV screening.** / Every 3 years from ages 93 years through ages 70 or 45 years with a history of 3 consecutive normal Pap tests. Testing can be stopped between 65 and 70 years with 3 consecutive normal Pap tests and no abnormal Pap or HPV tests in the past 10 years.  Fecal occult blood test (FOBT) of stool. / Every year beginning at age 8 years and continuing until age 45 years. You may not need to do this test if you get a colonoscopy every 10 years.  Flexible sigmoidoscopy or colonoscopy.** /  Every 5 years for a flexible sigmoidoscopy or every 10 years for a colonoscopy beginning at age 69 years and continuing until age 68 years.  Hepatitis C blood test.** / For all people born from [birth year] through 1965 and any individual with known risks for hepatitis C.  Osteoporosis screening.** / A one-time screening for women ages 7 years and over and women at risk for fractures or osteoporosis.  Skin self-exam. / Monthly.  Influenza vaccine. / Every year.  Tetanus, diphtheria, and acellular pertussis (Tdap/Td) vaccine.** / 1 dose of Td every 10 years.  Varicella vaccine.** / Consult your health care provider.  Zoster vaccine.** / 1 dose for adults aged 5 years or older.  Pneumococcal 13-valent conjugate (PCV13) vaccine.** / Consult your health care provider.  Pneumococcal polysaccharide (PPSV23) vaccine.** / 1 dose for all adults aged 74 years and older.  Meningococcal vaccine.** / Consult your health care provider.  Hepatitis A vaccine.** / Consult your health care provider.  Hepatitis B vaccine.** / Consult your health care provider.  Haemophilus influenzae type b (Hib) vaccine.** / Consult your health care provider. ** Family history and personal history of risk and conditions may change your health care provider's recommendations. Document Released: 11/23/2001 Document Revised: 07/18/2013  [facility] Patient Information 2014 [place], [place].   EXERCISE AND DIET:  We recommended that you start or continue a regular exercise program for good health. Regular exercise means any activity that makes your heart beat faster and makes you sweat.  We recommend exercising at least 30 minutes per day at least 3 days a week, preferably 5.  We also recommend a diet low in fat and sugar / carbohydrates.  Inactivity, poor dietary choices and obesity can cause diabetes, heart attack, stroke, and kidney damage, among others.     ALCOHOL AND SMOKING:  Women should limit their alcohol intake to no  more than 7 drinks/beers/glasses of wine (combined, not each!) per week. Moderation of alcohol intake to this level decreases your risk of breast cancer and liver damage.  ( And of course, no recreational drugs are part of a healthy lifestyle.)  Also, you should not be smoking at all or even being exposed to second hand smoke. Most people know smoking can  cause cancer, and various heart and lung diseases, but did you know it also contributes to weakening of your bones?  Aging of your skin?  Yellowing of your teeth and nails?   CALCIUM AND VITAMIN D:  Adequate intake of calcium and Vitamin D are recommended.  The recommendations for exact amounts of these supplements seem to change often, but generally speaking 600 mg of calcium (either carbonate or citrate) and 800 units of Vitamin D per day seems prudent. Certain women may benefit from higher intake of Vitamin D.  If you are among these women, your doctor will have told you during your visit.     PAP SMEARS:  Pap smears, to check for cervical cancer or precancers,  have traditionally been done yearly, although recent scientific advances have shown that most women can have pap smears less often.  However, every woman still should have a physical exam from her gynecologist or primary care physician every year. It will include a breast check, inspection of the vulva and vagina to check for abnormal growths or skin changes, a visual exam of the cervix, and then an exam to evaluate the size and shape of the uterus and ovaries.  And after 32 years of age, a rectal exam is indicated to check for rectal cancers. We will also provide age appropriate advice regarding health maintenance, like when you should have certain vaccines, screening for sexually transmitted diseases, bone density testing, colonoscopy, mammograms, etc.    MAMMOGRAMS:  All women over 47 years old should have a yearly mammogram. Many facilities now offer a "3D" mammogram, which may cost  around $50 extra out of pocket. If possible,  we recommend you accept the option to have the 3D mammogram performed.  It both reduces the number of women who will be called back for extra views which then turn out to be normal, and it is better than the routine mammogram at detecting truly abnormal areas.     COLONOSCOPY:  Colonoscopy to screen for colon cancer is recommended for all women at age 5.  We know, you hate the idea of the prep.  We agree, BUT, having colon cancer and not knowing it is worse!!  Colon cancer so often starts as a polyp that can be seen and removed at colonscopy, which can quite literally save your life!  And if your first colonoscopy is normal and you have no family history of colon cancer, most women don't have to have it again for 10 years.  Once every ten years, you can do something that may end up saving your life, right?  We will be happy to help you get it scheduled when you are ready.  Be sure to check your insurance coverage so you understand how much it will cost.  It may be covered as a preventative service at no cost, but you should check your particular policy.    Overall you are doing a GREAT JOB taking care of yourself! Your labs, weight, and blood pressure- fantastic! Remain well hydrated, continue to eat healthy and continue regular exercise. If your palpitations re-occur/worsen-please call Dr. Harding/Cardiology. If you would like a referral to psychology- please call the clinic. Continue to social distance and wear a mask when in clinic. Recommend annual physical, fasting labs the week before. GREAT TO SEE YOU!

## 2019-05-08 NOTE — Progress Notes (Signed)
Subjective:    Patient ID: Stephanie Gomez, female    DOB: 03-27-1986, 33 y.o.   MRN: [id number]  HPI: 03/01/2019 OV: Ms. Stephanie Gomez connects via WebEx today to establish as a new pt. She is a pleasant 33 year old female. PMH: Hx of palpitations/PVC Fall 2018-Winter 2019 Seen by Dr. Harding/Cards 12/08/2017 Echo: Essentially normal EF 50-55% 12/08/2017 Cardiac Event Monitor:  Mostly sinus rhythm. Rare intermittent PVCs  No PACs, PAT or arrhythmia such as SVT, A. fib or a flutter.  PVCs and single with no couplets, bigeminy, trigeminy or runs. Recommendation- avoid beta blocker, use biofeedback technique to reduce palpitations. F/u with cards PRN. She has had lingering productive cough since 11/2018- multiple ED/IC visits, CXR all normal She has used Albuterol inhaler "only a handful of times" months ago, now using daily OTX Zyrtec.  She reports wheezing when she skips a dose. She has never been dx'd with Asthma or evaluated by Pulmonology. She denies tobacco/vape use, social ETOH use She estimates to drink >100 oz water/day, follows heart healthy diet. 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  05/08/2019 OV:  Ms. Stephanie SquibbWall is here for CPE She continues to exercise vigorously- crossfit/HITT training several days/week She denies tobacco/vape use, social ETOH use She estimates to drink >100 oz water/day, follows heart healthy diet She reports a few palpitations over the weekend- denies CP/dyspnea/diaphoresis   After she relaxed- palpitations resolved after a few minutes   05/02/2019 Labs: COVID Antibody test is Negative TSH-WNL, 1.960  Lipid Panel-  Tot 134  TGs 60  HDL 67  LDL 55  A1c-WNL, 4.9  CMP-Stable  CBC-Stable   Healthcare Maintenance: PAP-UTD Immunizations-UTD  Patient Care Team    Relationship Specialty Notifications Start End  William Hamburgeranford, Yasenia Reedy D, NP PCP - General Family Medicine  02/22/19   Sigmund HazelMiller, Lisa, MD Consulting Physician Family Medicine   02/22/19     Patient Active Problem List   Diagnosis Date Noted  . Healthcare maintenance 03/01/2019  . PVC's (premature ventricular contractions) 11/29/2017  . Sensation of chest tightness 11/29/2017  . Vaginal delivery 02/27/2016  . Obesity (BMI 30.0-34.9) 02/26/2016  . Anxiety 02/26/2016  . Allergy to penicillin 02/26/2016     Past Medical History:  Diagnosis Date  . Anxiety   . patient-activated cardiac event monitor 11/2017   Mostly NSR (some brady & tachy) - rates 45-142 bpm.  Rare PVCs (singles - no couplets, runs or bi-trigeminy), No PACs, PAT or arrhythmia such as SVT, A. fib or a flutter.  Marland Kitchen. PONV (postoperative nausea and vomiting)      Past Surgical History:  Procedure Laterality Date  . NASAL FRACTURE SURGERY    . TRANSTHORACIC ECHOCARDIOGRAM  11/2017   EF 50 and 55% with no regional Lile motion normality.  Normal valves.  No mitral prolapse.  . WISDOM TOOTH EXTRACTION       Family History  Adopted: Yes  Problem Relation Age of Onset  . Seizures Mother        She is not aware of any details.  This is her birth mother, and she has limited information  . Hyperlipidemia Mother   . Hypertension Mother   . Skin cancer Maternal Grandfather   . Skin cancer Paternal Grandfather      Social History   Substance and Sexual Activity  Drug Use No     Social History   Substance and Sexual Activity  Alcohol Use Yes  . Alcohol/week: 4.0 standard drinks  .  Types: 4 Cans of beer per week  . Frequency: Never     Social History   Tobacco Use  Smoking Status Never Smoker  Smokeless Tobacco Never Used     Outpatient Encounter Medications as of 05/08/2019  Medication Sig  . albuterol (PROVENTIL HFA;VENTOLIN HFA) 108 (90 Base) MCG/ACT inhaler Inhale 1-2 puffs into the lungs every 6 (six) hours as needed for wheezing or shortness of breath.  . cetirizine (ZYRTEC) 10 MG tablet Take 10 mg by mouth daily.  . Cholecalciferol (VITAMIN D3) 125 MCG (5000 UT) CAPS  Take 1 capsule by mouth daily.  . Multiple Vitamin (MULTIVITAMIN) tablet Take 1 tablet by mouth daily.  . vitamin C (ASCORBIC ACID) 250 MG tablet Take 250 mg by mouth daily.   No facility-administered encounter medications on file as of 05/08/2019.     Allergies: Amoxil [amoxicillin] and Penicillins  Body mass index is 27.9 kg/m.  Blood pressure 106/65, pulse 60, temperature 98.7 F (37.1 C), temperature source Oral, height 4' 11.5" (1.511 m), weight 140 lb 8 oz (63.7 kg), last menstrual period 05/06/2019, SpO2 100 %.  Review of Systems  Constitutional: Negative for activity change, appetite change, chills, diaphoresis, fatigue, fever and unexpected weight change.  HENT: Negative for congestion.   Eyes: Negative for visual disturbance.  Respiratory: Negative for cough, chest tightness, shortness of breath, wheezing and stridor.   Cardiovascular: Positive for palpitations. Negative for chest pain and leg swelling.  Gastrointestinal: Negative for abdominal distention, abdominal pain, anal bleeding, constipation, diarrhea, nausea and vomiting.  Endocrine: Negative for cold intolerance, heat intolerance, polydipsia, polyphagia and polyuria.  Genitourinary: Negative for difficulty urinating and flank pain.  Musculoskeletal: Negative for arthralgias, back pain, gait problem, joint swelling, myalgias, neck pain and neck stiffness.  Skin: Negative for color change, pallor, rash and wound.  Neurological: Negative for dizziness and headaches.  Hematological: Negative for adenopathy.  Psychiatric/Behavioral: Negative for agitation, behavioral problems, confusion, decreased concentration, dysphoric mood, hallucinations, self-injury, sleep disturbance and suicidal ideas. The patient is not nervous/anxious and is not hyperactive.        Objective:   Physical Exam Vitals signs and nursing note reviewed.  Constitutional:      General: She is not in acute distress.    Appearance: Normal  appearance. She is normal weight. She is not ill-appearing or diaphoretic.  HENT:     Head: Normocephalic and atraumatic.     Right Ear: Tympanic membrane, ear canal and external ear normal. There is no impacted cerumen.     Left Ear: Tympanic membrane, ear canal and external ear normal. There is no impacted cerumen.     Nose: Nose normal. No congestion.     Mouth/Throat:     Mouth: Mucous membranes are moist.     Pharynx: No oropharyngeal exudate or posterior oropharyngeal erythema.  Eyes:     Extraocular Movements: Extraocular movements intact.     Conjunctiva/sclera: Conjunctivae normal.     Pupils: Pupils are equal, round, and reactive to light.  Neck:     Musculoskeletal: Normal range of motion.  Cardiovascular:     Rate and Rhythm: Normal rate and regular rhythm.     Pulses: Normal pulses.     Heart sounds: Normal heart sounds. No murmur. No friction rub. No gallop.   Pulmonary:     Effort: Pulmonary effort is normal. No respiratory distress.     Breath sounds: Normal breath sounds. No stridor. No wheezing, rhonchi or rales.  Chest:     Chest  Brayman: No tenderness.  Abdominal:     General: Abdomen is flat. Bowel sounds are normal. There is no distension.     Palpations: Abdomen is soft. There is no mass.     Tenderness: There is no abdominal tenderness. There is no right CVA tenderness, left CVA tenderness, guarding or rebound.     Hernia: No hernia is present.  Musculoskeletal: Normal range of motion.        General: No swelling or tenderness.  Skin:    General: Skin is warm and dry.     Capillary Refill: Capillary refill takes less than 2 seconds.     Findings: No erythema.  Neurological:     Mental Status: She is alert and oriented to person, place, and time.     Coordination: Coordination normal.  Psychiatric:        Mood and Affect: Mood normal.        Behavior: Behavior normal.        Thought Content: Thought content normal.        Judgment: Judgment normal.         Assessment & Plan:   1. Healthcare maintenance     Healthcare maintenance Overall you are doing a GREAT JOB taking care of yourself! Your labs, weight, and blood pressure- fantastic! Remain well hydrated, continue to eat healthy and continue regular exercise. If your palpitations re-occur/worsen-please call Dr. Harding/Cardiology. If you would like a referral to psychology- please call the clinic. Continue to social distance and wear a mask when in clinic. Recommend annual physical, fasting labs the week before.    FOLLOW-UP:  Return in about 1 year (around 05/07/2020) for CPE, Fasting Labs.

## 2019-10-22 IMAGING — DX DG CHEST 2V
2 series · 2 of 2 positions shown · non-contrast
Comparison: None.

CLINICAL DATA: 32-year-old female with a history of chest pain and
shortness of breath

EXAM:
CHEST - 2 VIEW

[chest pa]
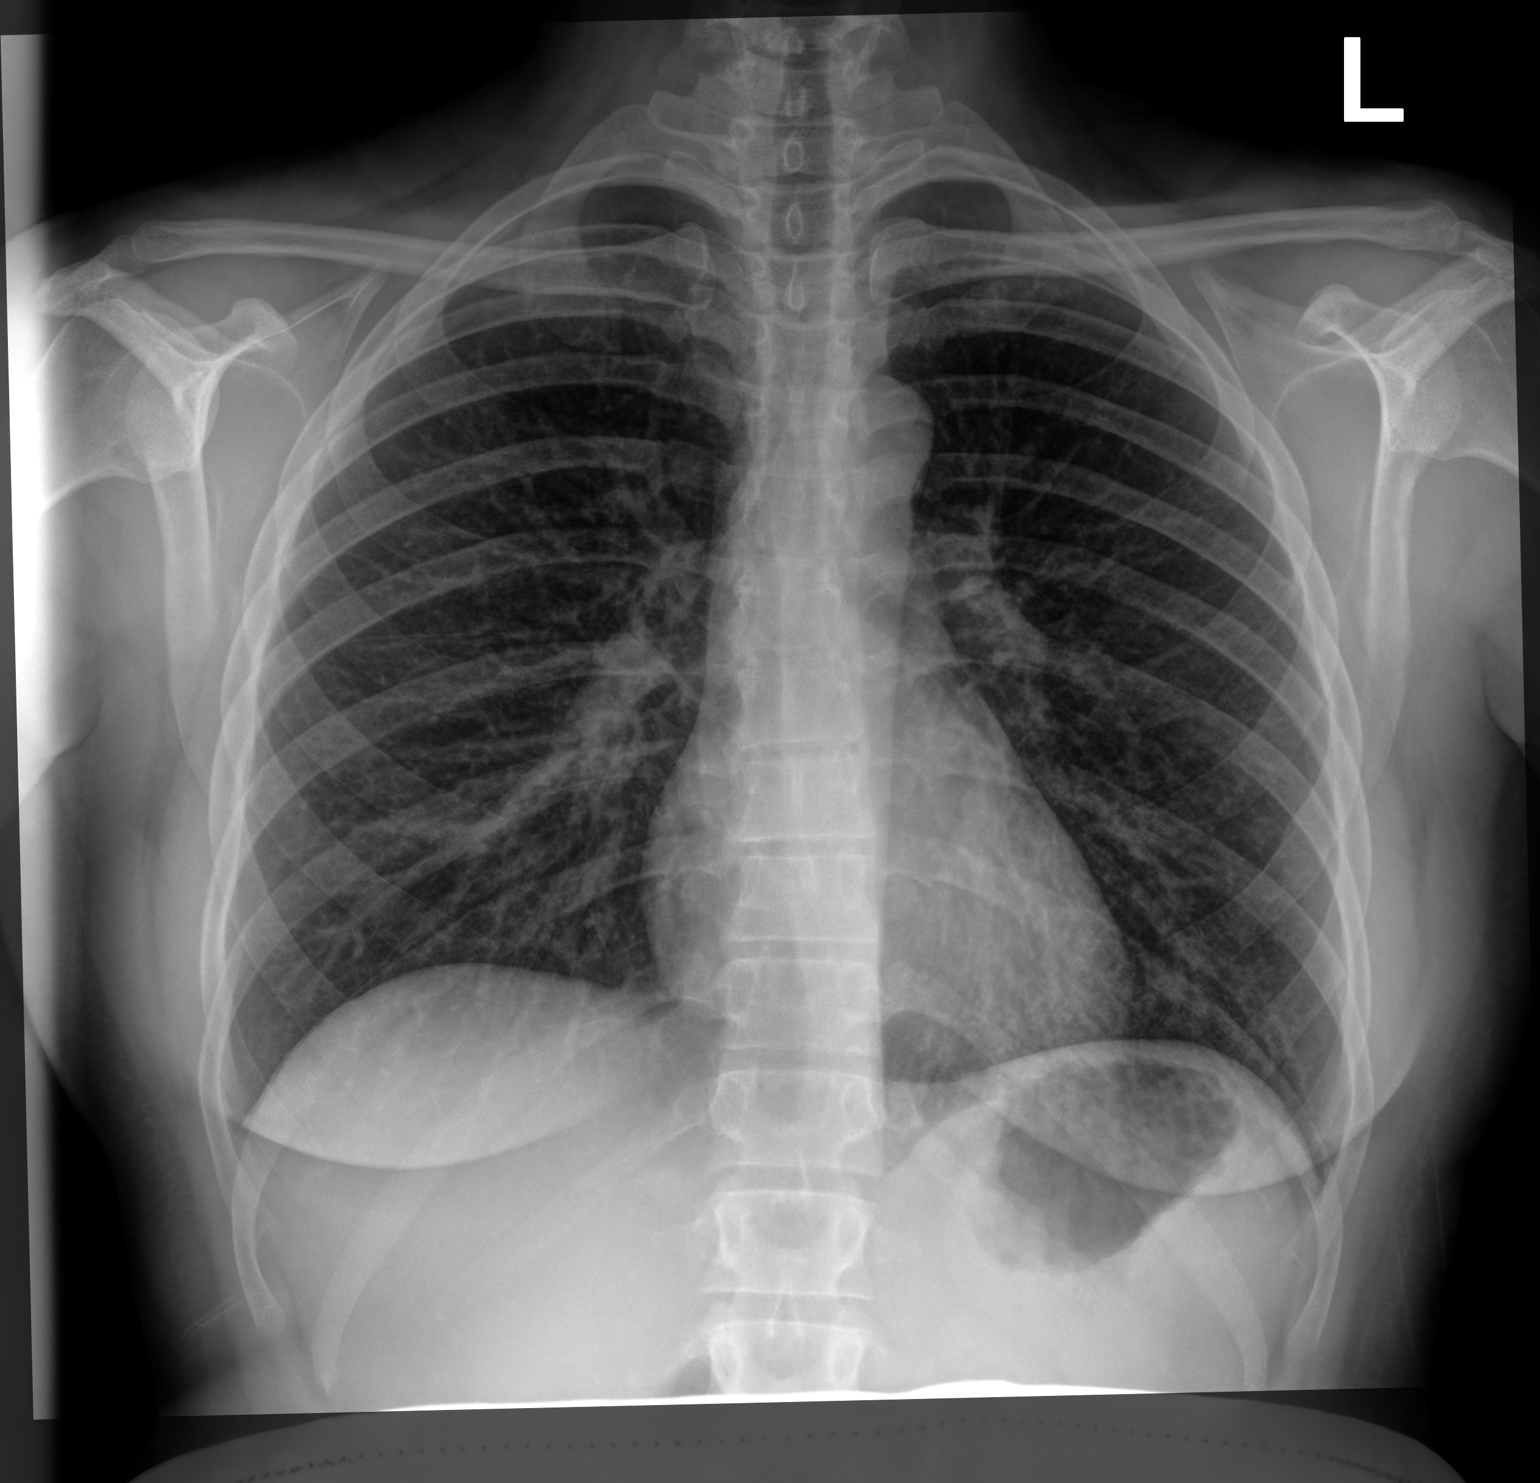

[chest lat]
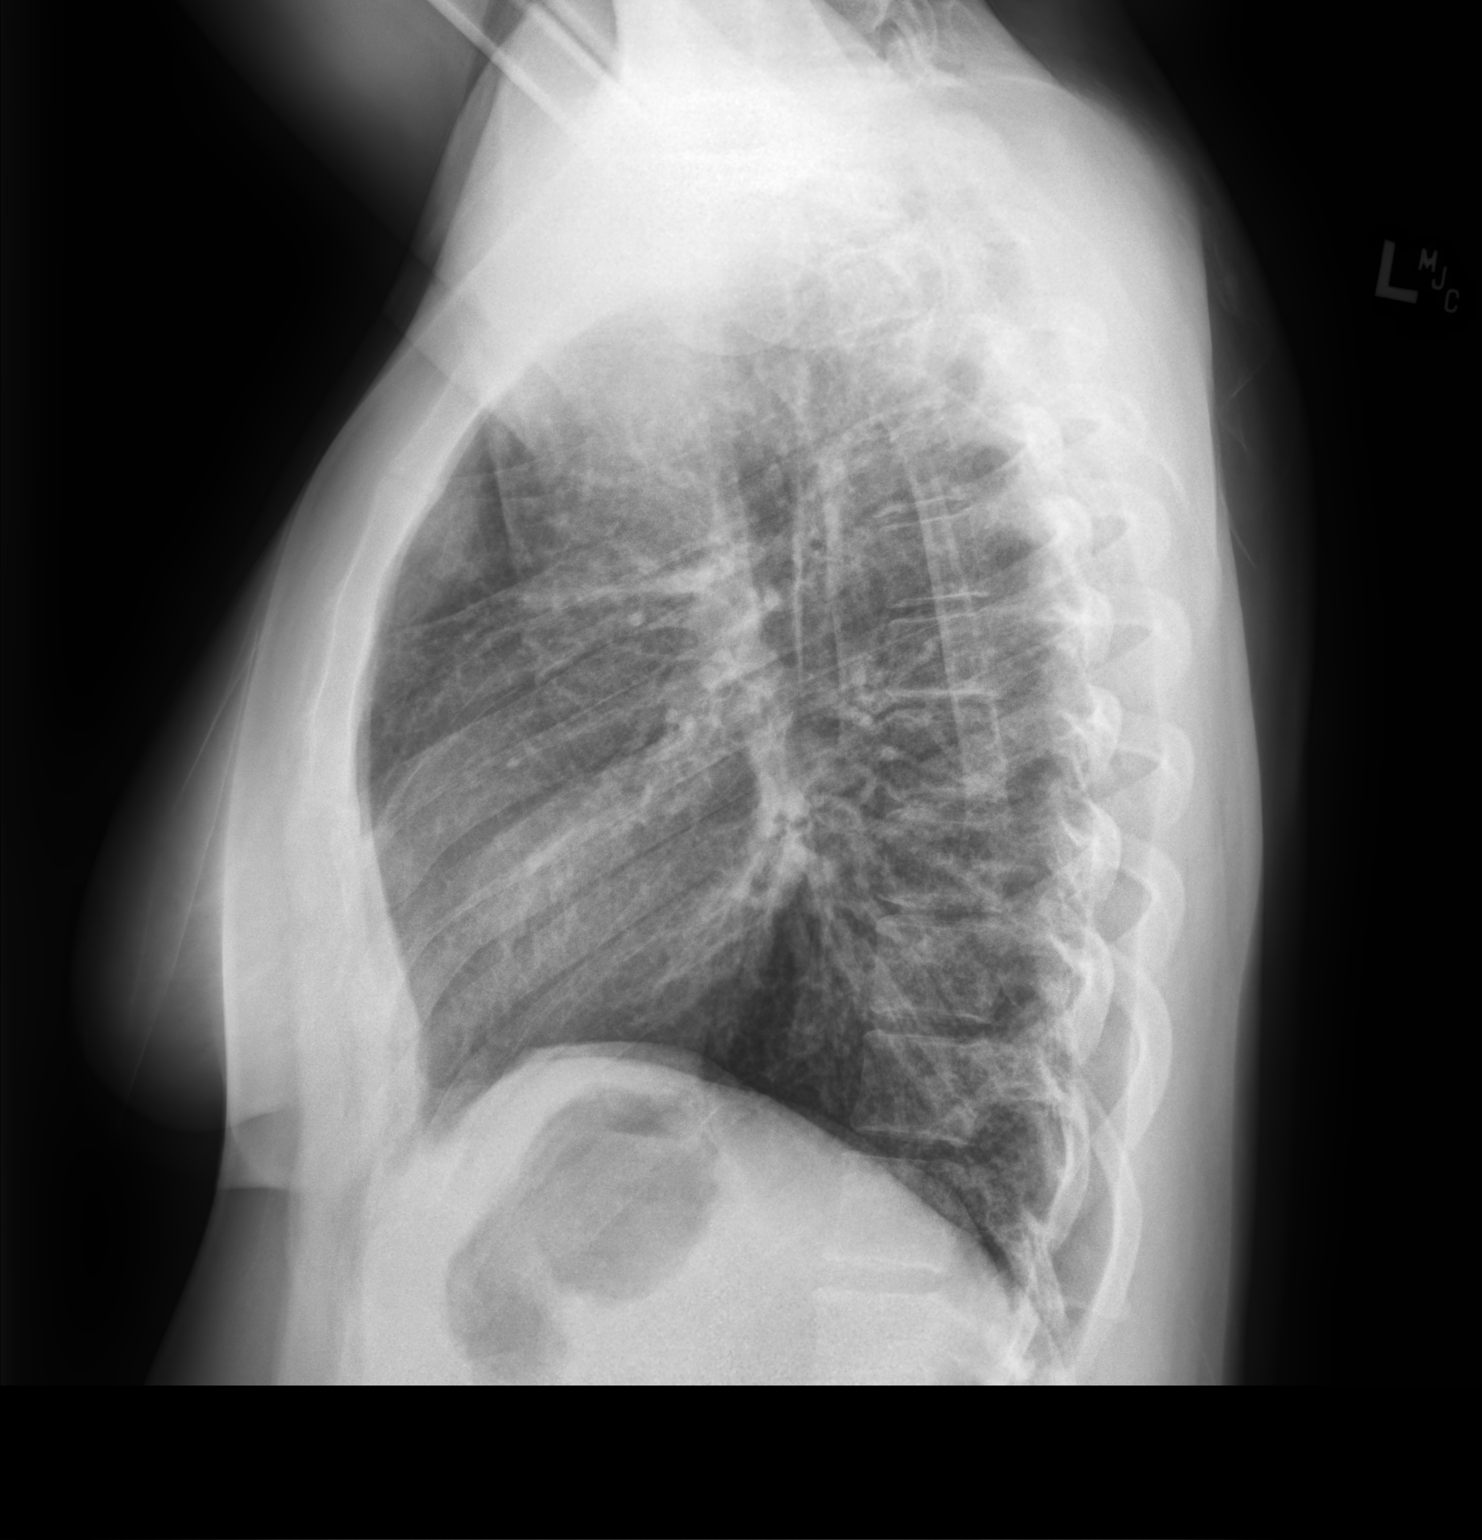

[2 of 2 positions shown; findings below may reference images not displayed]

FINDINGS: The heart size and mediastinal contours are within normal limits.
Both lungs are clear. The visualized skeletal structures are
unremarkable.
IMPRESSION: Negative for acute cardiopulmonary disease

## 2019-10-27 IMAGING — CR DG RIBS W/ CHEST 3+V*R*
3 series · 3 of 3 positions shown · non-contrast
Comparison: Chest x-ray dated November 27, 2018.

CLINICAL DATA: Cough for the past 2 and half weeks. Right-sided rib
pain.

EXAM:
RIGHT RIBS AND CHEST - 3+ VIEW

[chest pa]
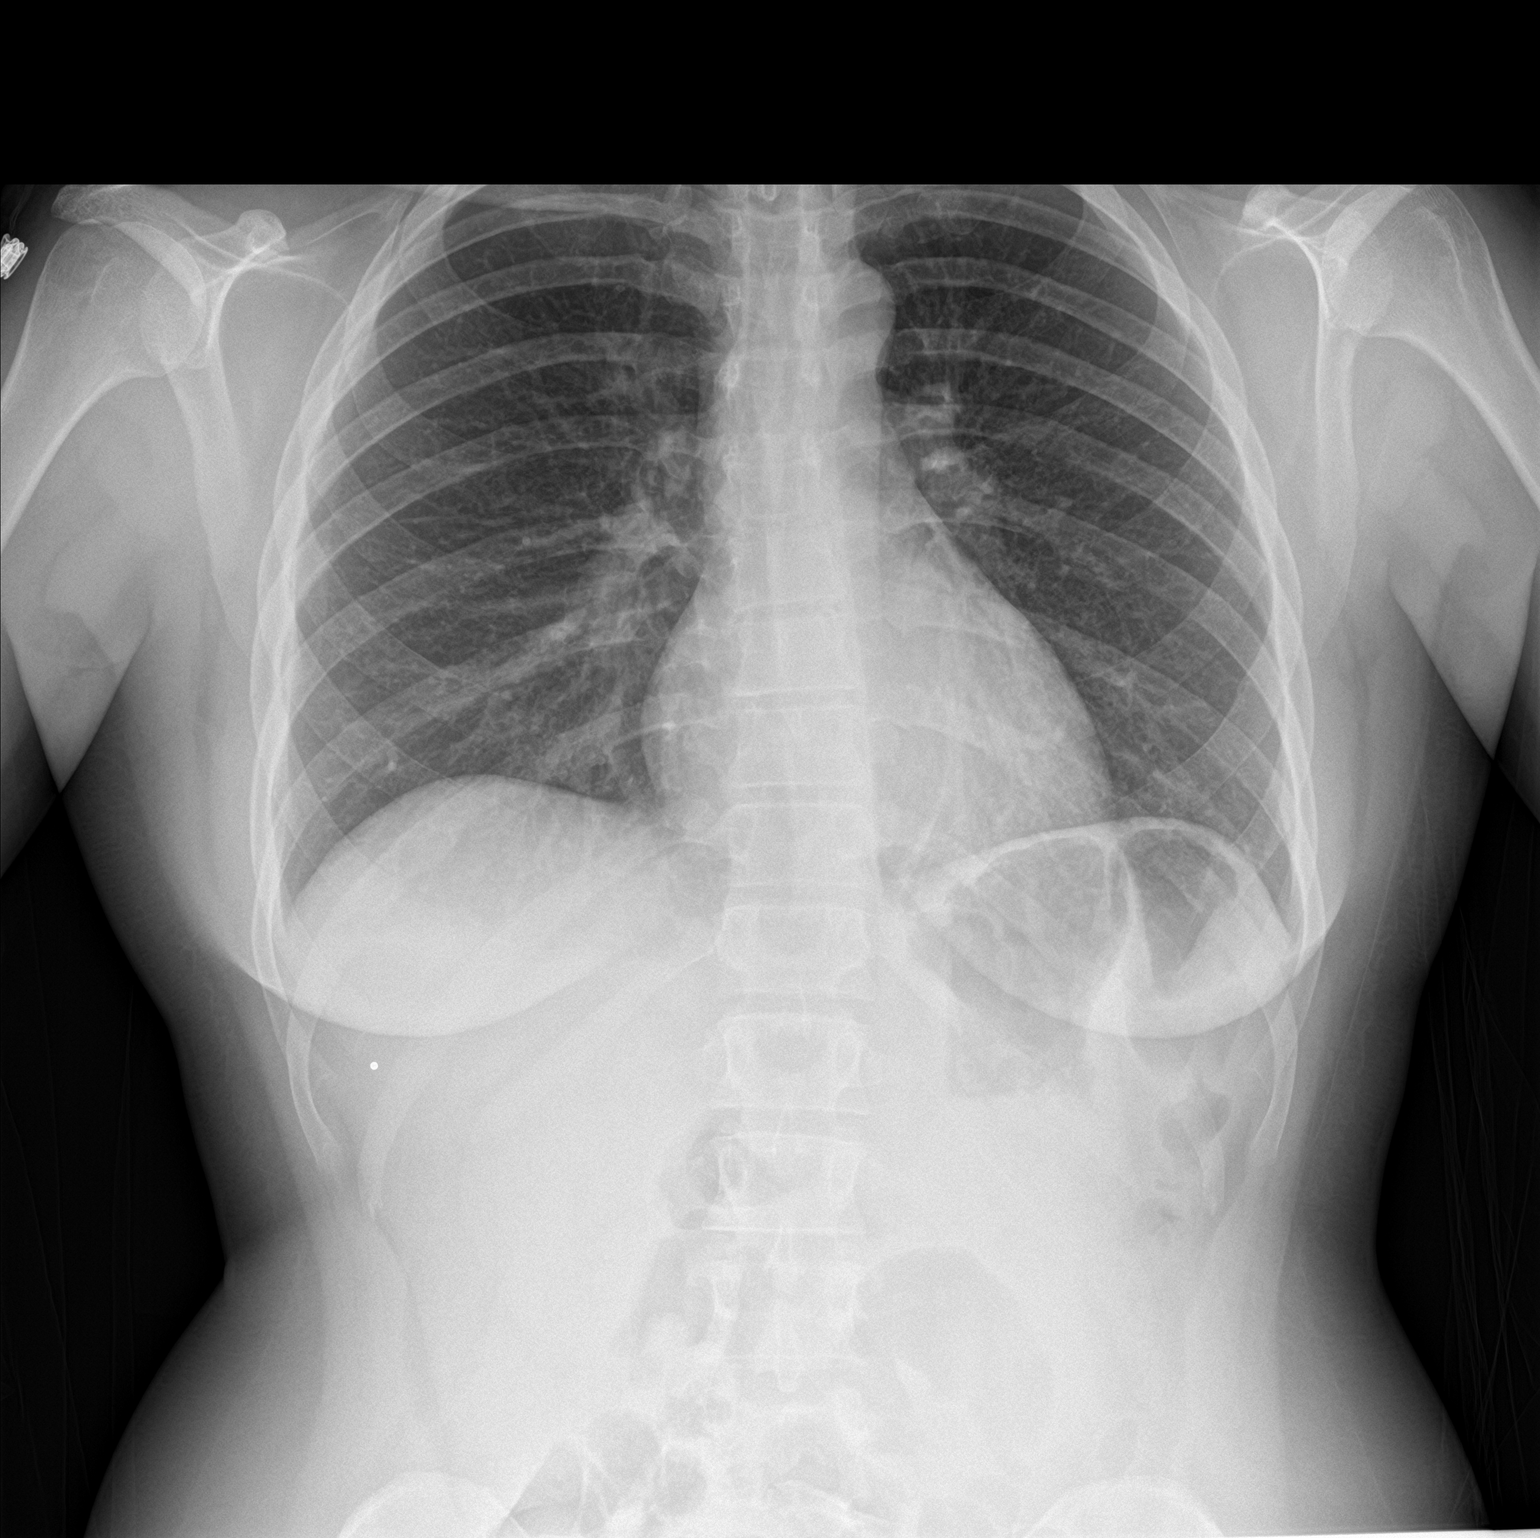

[rib pa]
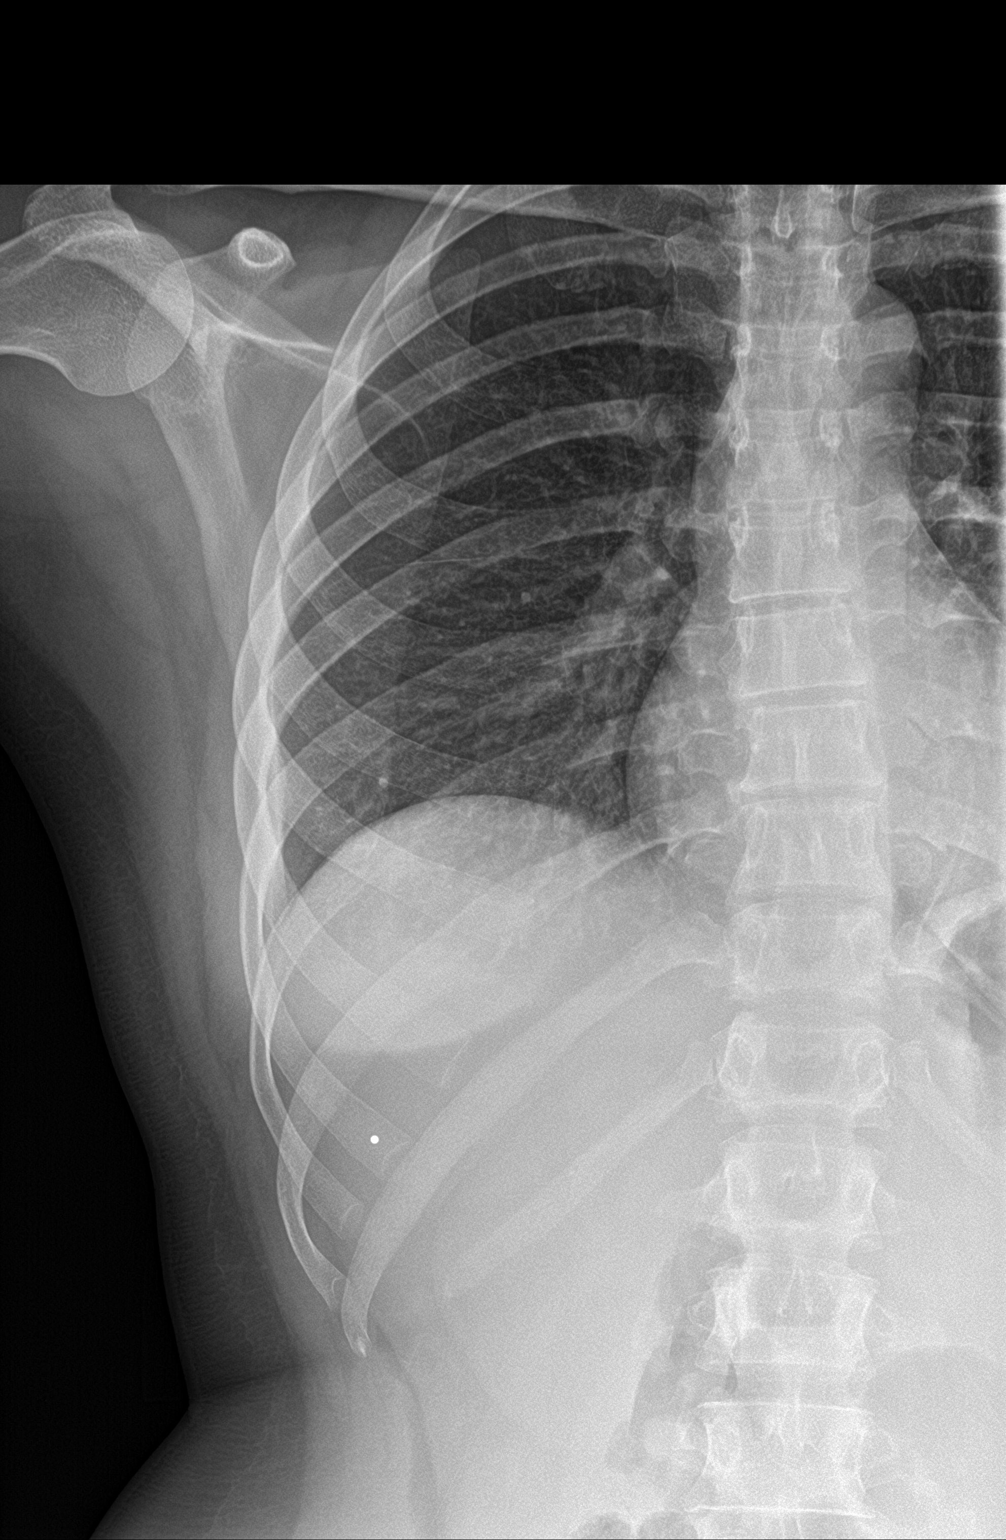

[rib pa obl]
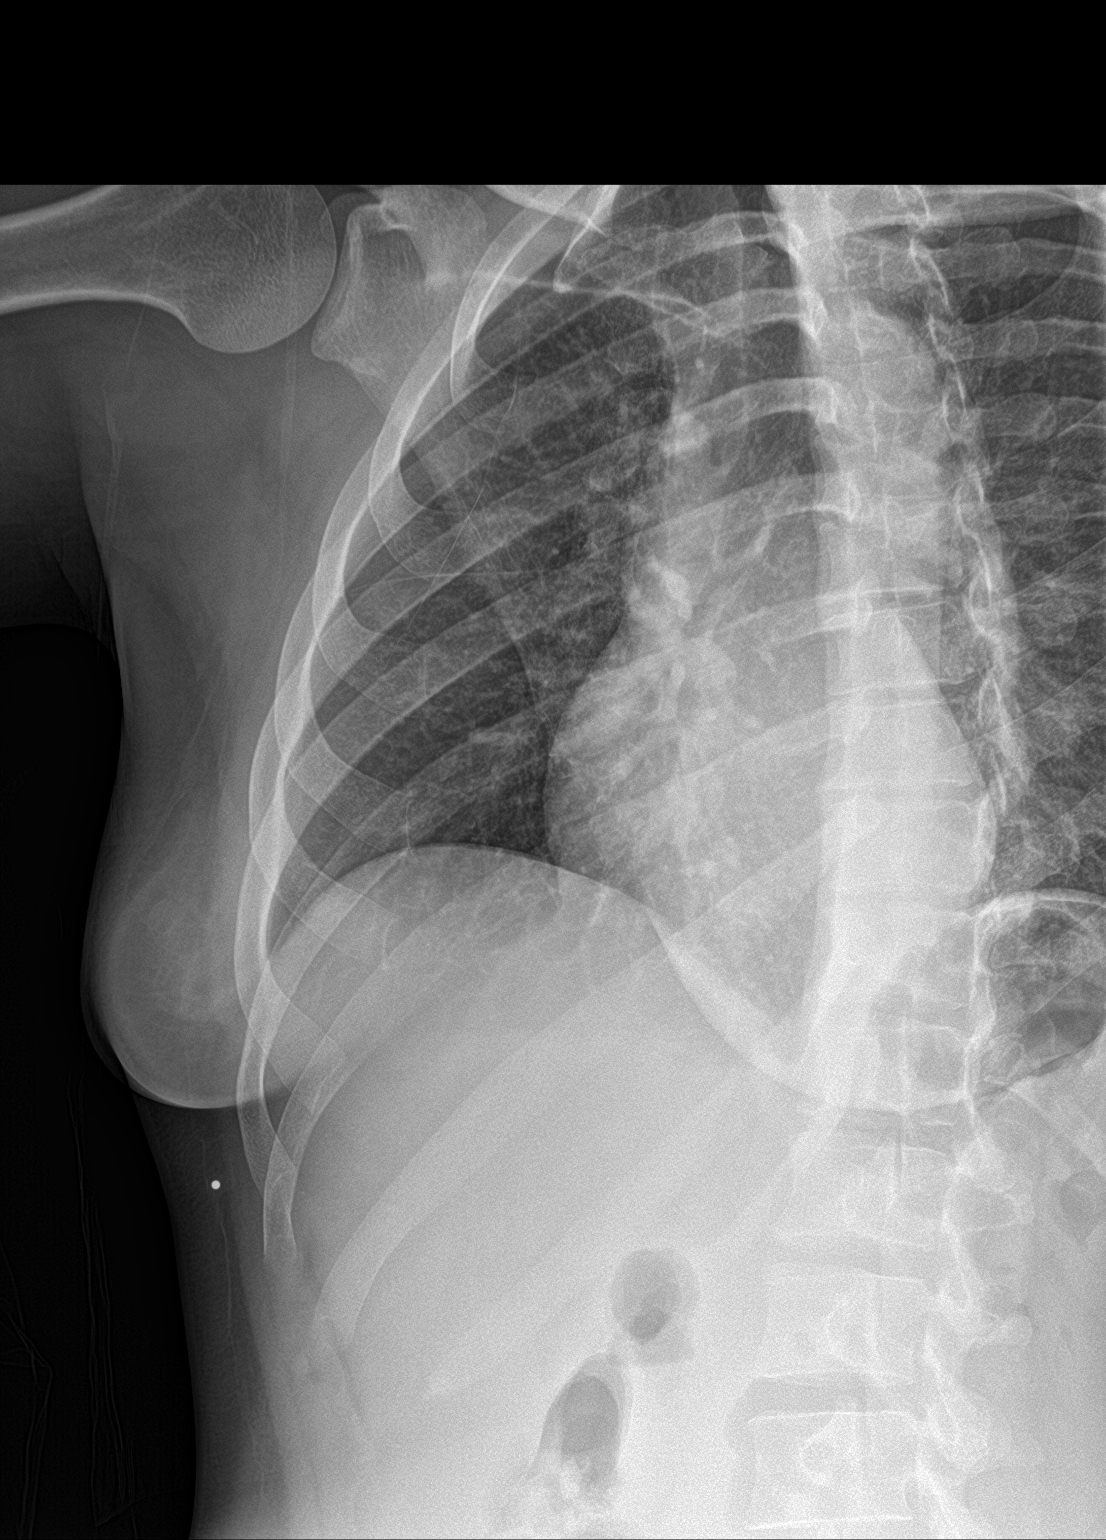

[3 of 3 positions shown; findings below may reference images not displayed]

FINDINGS: No fracture or other bone lesions are seen involving the ribs. There
is no evidence of pneumothorax or pleural effusion. Both lungs are
clear. Heart size and mediastinal contours are within normal limits.
IMPRESSION: Negative.

## 2019-12-18 ENCOUNTER — Telehealth: Payer: Self-pay | Admitting: Cardiology

## 2019-12-18 NOTE — Telephone Encounter (Signed)
   Pt said she was diagnosed with PVC a few years ago and it was treated. She said recently its coming back and its more frequent. She wanted to ask Dr. Herbie Baltimore if it's safe for her to get covid vaccine or wait until PVC is gone.  Please advise

## 2019-12-18 NOTE — Telephone Encounter (Signed)
Pt notified she states that she does not want to have an appt at this time she would like to "wait it out" she will CB if she needs an appt

## 2020-05-15 ENCOUNTER — Other Ambulatory Visit: Payer: Self-pay

## 2020-05-15 ENCOUNTER — Ambulatory Visit (INDEPENDENT_AMBULATORY_CARE_PROVIDER_SITE_OTHER): Payer: 59 | Admitting: Physician Assistant

## 2020-05-15 ENCOUNTER — Encounter: Payer: Self-pay | Admitting: Physician Assistant

## 2020-05-15 VITALS — BP 122/76 | HR 90 | Temp 98.7°F | Ht 59.5 in | Wt 129.1 lb

## 2020-05-15 DIAGNOSIS — Z0000 Encounter for general adult medical examination without abnormal findings: Secondary | ICD-10-CM

## 2020-05-15 DIAGNOSIS — B349 Viral infection, unspecified: Secondary | ICD-10-CM | POA: Insufficient documentation

## 2020-05-15 NOTE — Progress Notes (Signed)
Female Physical   Impression and Recommendations:    No diagnosis found.   1) Anticipatory Guidance: Discussed skin CA prevention and sunscreen when outside along with skin surveillance; eating a balanced and modest diet; physical activity at least 25 minutes per day or minimum of 150 min/ week moderate to intense activity.  2) Immunizations / Screenings / Labs:   All immunizations are up-to-date per recommendations or will be updated today if pt allows.    - Patient understands with dental and vision screens they will schedule independently.  - Will obtain CBC, CMP, HgA1c, Lipid panel, TSH and vit D when fasting, if not already done past 12 mo/ recently  -UTD on Tdap -UTD on pap smear, followed by Ob-Gyn  3) Weight:  BMI meaning discussed with patient.  Discussed goal to improve diet habits to improve overall feelings of well being and objective health data. Improve nutrient density of diet through increasing intake of fruits and vegetables and decreasing saturated fats, white flour products and refined sugars.  4)Healthcare Maintenance: -Follow heart healthy diet. -Continue moderate physical activity. -Stay well hydrated. -Return for fasting bloodwork. -Follow up in 1 yr for CPE and FBW  No orders of the defined types were placed in this encounter.   No orders of the defined types were placed in this encounter.    No follow-ups on file.   Reminded pt important of f-up preventative CPE in 1 year.  Reminded pt again, this is in addition to any chronic care visits.    Gross side effects, risk and benefits, and alternatives of medications discussed with patient.  Patient is aware that all medications have potential side effects and we are unable to predict every side effect or drug-drug interaction that may occur.  Expresses verbal understanding and consents to current therapy plan and treatment regimen.  F-up preventative CPE in 1 year- reminded pt again, this is in addition  to any chronic care visits.    Please see orders placed and AVS handed out to patient at the end of our visit for further patient instructions/ counseling done pertaining to today's office visit.      Subjective:     CPE HPI: Stephanie Gomez is a 34 y.o. female who presents to [facility] at [facility] today for a yearly health maintenance exam.   Health Maintenance Summary  - Reviewed and updated, unless pt declines services.  Tobacco History Reviewed:  Y, never smoker Alcohol and/or drug use:    No concerns; no use Exercise Habits:   Moderate physical activity Dental Home: Y Eye exams: Y Dermatology home: N  Female Health:  PAP Smear - last known results: 11/11/2016, normal- followed by Ob-Gyn STD concerns:   none Lumps or breast concerns:  none     Immunization History  Administered Date(s) Administered  . Influenza Split 08/05/2015  . Influenza-Unspecified 07/21/2019  . PFIZER SARS-COV-2 Vaccination 12/21/2019, 01/14/2020  . Tdap 01/15/2016     Health Maintenance  Topic Date Due  . PAP SMEAR-Modifier  11/12/2019  . INFLUENZA VACCINE  05/11/2020  . Hepatitis C Screening  05/15/2021 (Originally September 19, 1986)  . TETANUS/TDAP  01/14/2026  . COVID-19 Vaccine  Completed  . HIV Screening  Completed     Wt Readings from Last 3 Encounters:  05/15/20 129 lb 1.6 oz (58.6 kg)  05/08/19 140 lb 8 oz (63.7 kg)  03/01/19 135 lb (61.2 kg)   BP Readings from Last 3 Encounters:  05/15/20 122/76  05/08/19 106/65  12/02/18 108/78   Pulse Readings from Last 3 Encounters:  05/15/20 90  05/08/19 60  12/02/18 78     Past Medical History:  Diagnosis Date  . Anxiety   . patient-activated cardiac event monitor 11/2017   Mostly NSR (some brady & tachy) - rates 45-142 bpm.  Rare PVCs (singles - no couplets, runs or bi-trigeminy), No PACs, PAT or arrhythmia such as SVT, A. fib or a flutter.  Marland Kitchen PONV (postoperative nausea and vomiting)       Past Surgical  History:  Procedure Laterality Date  . NASAL FRACTURE SURGERY    . TRANSTHORACIC ECHOCARDIOGRAM  11/2017   EF 50 and 55% with no regional Pietrzyk motion normality.  Normal valves.  No mitral prolapse.  . WISDOM TOOTH EXTRACTION        Family History  Adopted: Yes  Problem Relation Age of Onset  . Seizures Mother        She is not aware of any details.  This is her birth mother, and she has limited information  . Hyperlipidemia Mother   . Hypertension Mother   . Skin cancer Maternal Grandfather   . Skin cancer Paternal Grandfather       Social History   Substance and Sexual Activity  Drug Use No  ,   Social History   Substance and Sexual Activity  Alcohol Use Yes  . Alcohol/week: 4.0 standard drinks  . Types: 4 Cans of beer per week  ,   Social History   Tobacco Use  Smoking Status Never Smoker  Smokeless Tobacco Never Used  ,   Social History   Substance and Sexual Activity  Sexual Activity Yes  . Birth control/protection: Condom   Comment: Not currently using either    Current Outpatient Medications on File Prior to Visit  Medication Sig Dispense Refill  . albuterol (PROVENTIL HFA;VENTOLIN HFA) 108 (90 Base) MCG/ACT inhaler Inhale 1-2 puffs into the lungs every 6 (six) hours as needed for wheezing or shortness of breath. 1 Inhaler 0  . cetirizine (ZYRTEC) 10 MG tablet Take 10 mg by mouth daily.    . Cholecalciferol (VITAMIN D3) 125 MCG (5000 UT) CAPS Take 1 capsule by mouth daily.    . Multiple Vitamin (MULTIVITAMIN) tablet Take 1 tablet by mouth daily.    . vitamin C (ASCORBIC ACID) 250 MG tablet Take 250 mg by mouth daily.     No current facility-administered medications on file prior to visit.    Allergies: Amoxil [amoxicillin] and Penicillins  Review of Systems: General:   Denies fever, chills, unexplained weight loss.  Optho/Auditory:   Denies visual changes, blurred vision/LOV Respiratory:   Denies SOB, DOE more than baseline levels.    Cardiovascular:   Denies chest pain, new onset palpitations, peripheral edema  Gastrointestinal:   Denies nausea, vomiting, diarrhea.  Genitourinary: Denies dysuria, freq/ urgency, flank pain or discharge from genitals.  Endocrine:     Denies hot or cold intolerance, polyuria, polydipsia. Musculoskeletal:   Denies unexplained myalgias, joint swelling, unexplained arthralgias, gait problems.  Skin:  Denies rash, suspicious lesions Neurological:     Denies dizziness, unexplained weakness, numbness  Psychiatric/Behavioral:   Denies mood changes, suicidal or homicidal ideations, hallucinations    Objective:    Blood pressure 122/76, pulse 90, temperature 98.7 F (37.1 C), temperature source Oral, height 4' 11.5" (1.511 m), weight 129 lb 1.6 oz (58.6 kg), SpO2 100 %. Body mass index is 25.64 kg/m. General Appearance:    Alert, cooperative, no  distress, appears stated age  Head:    Normocephalic, without obvious abnormality, atraumatic  Eyes:    PERRL, conjunctiva/corneas clear, EOM's intact, fundi    benign, both eyes  Ears:    Normal TM's and external ear canals, both ears  Nose:   Nares normal, septum midline, mucosa normal, no drainage    or sinus tenderness  Throat:   Lips w/o lesion, mucosa moist, and tongue normal; teeth and   gums normal  Neck:   Supple, symmetrical, trachea midline, no adenopathy;    thyroid:  no enlargement/tenderness/nodules; no carotid   bruit or JVD  Back:     Symmetric, no curvature, ROM normal, no CVA tenderness  Lungs:     Clear to auscultation bilaterally, respirations unlabored, no       Wh/ R/ R  Chest Bueno:    No tenderness or gross deformity; normal excursion   Heart:    Regular rate and rhythm, S1 and S2 normal, no murmur, rub   or gallop  Breast Exam:    Deferred to Ob-Gyn.  Abdomen:     Soft, non-tender, bowel sounds active all four quadrants, No   G/R/R, no masses, no organomegaly  Genitalia:    Deferred to Ob-Gyn.  Rectal:    Deferred to  Ob-Gyn.  Extremities:   Extremities normal, atraumatic, no cyanosis or gross edema  Pulses:   2+ and symmetric all extremities  Skin:   Warm, dry, Skin color, texture, turgor normal, no obvious rashes or lesions Psych: No HI/SI, judgement and insight good, Euthymic mood. Full Affect.  Neurologic:   CNII-XII intact, normal strength, sensation and reflexes    Throughout

## 2020-05-15 NOTE — Patient Instructions (Signed)
Preventive Care 33-33 Years Old, Female Preventive care refers to visits with your health care provider and lifestyle choices that can promote health and wellness. This includes:  A yearly physical exam. This may also be called an annual well check.  Regular dental visits and eye exams.  Immunizations.  Screening for certain conditions.  Healthy lifestyle choices, such as eating a healthy diet, getting regular exercise, not using drugs or products that contain nicotine and tobacco, and limiting alcohol use. What can I expect for my preventive care visit? Physical exam Your health care provider will check your:  Height and weight. This may be used to calculate body mass index (BMI), which tells if you are at a healthy weight.  Heart rate and blood pressure.  Skin for abnormal spots. Counseling Your health care provider may ask you questions about your:  Alcohol, tobacco, and drug use.  Emotional well-being.  Home and relationship well-being.  Sexual activity.  Eating habits.  Work and work Statistician.  Method of birth control.  Menstrual cycle.  Pregnancy history. What immunizations do I need?  Influenza (flu) vaccine  This is recommended every year. Tetanus, diphtheria, and pertussis (Tdap) vaccine  You may need a Td booster every 10 years. Varicella (chickenpox) vaccine  You may need this if you have not been vaccinated. Human papillomavirus (HPV) vaccine  If recommended by your health care provider, you may need three doses over 6 months. Measles, mumps, and rubella (MMR) vaccine  You may need at least one dose of MMR. You may also need a second dose. Meningococcal conjugate (MenACWY) vaccine  One dose is recommended if you are age 40-21 years and a first-year college student living in a residence hall, or if you have one of several medical conditions. You may also need additional booster doses. Pneumococcal conjugate (PCV13) vaccine  You may need  this if you have certain conditions and were not previously vaccinated. Pneumococcal polysaccharide (PPSV23) vaccine  You may need one or two doses if you smoke cigarettes or if you have certain conditions. Hepatitis A vaccine  You may need this if you have certain conditions or if you travel or work in places where you may be exposed to hepatitis A. Hepatitis B vaccine  You may need this if you have certain conditions or if you travel or work in places where you may be exposed to hepatitis B. Haemophilus influenzae type b (Hib) vaccine  You may need this if you have certain conditions. You may receive vaccines as individual doses or as more than one vaccine together in one shot (combination vaccines). Talk with your health care provider about the risks and benefits of combination vaccines. What tests do I need?  Blood tests  Lipid and cholesterol levels. These may be checked every 5 years starting at age 33.  Hepatitis C test.  Hepatitis B test. Screening  Diabetes screening. This is done by checking your blood sugar (glucose) after you have not eaten for a while (fasting).  Sexually transmitted disease (STD) testing.  BRCA-related cancer screening. This may be done if you have a family history of breast, ovarian, tubal, or peritoneal cancers.  Pelvic exam and Pap test. This may be done every 3 years starting at age 33. Starting at age 33, this may be done every 5 years if you have a Pap test in combination with an HPV test. Talk with your health care provider about your test results, treatment options, and if necessary, the need for more tests.  Follow these instructions at home: Eating and drinking   Eat a diet that includes fresh fruits and vegetables, whole grains, lean protein, and low-fat dairy.  Take vitamin and mineral supplements as recommended by your health care provider.  Do not drink alcohol if: ? Your health care provider tells you not to drink. ? You are  pregnant, may be pregnant, or are planning to become pregnant.  If you drink alcohol: ? Limit how much you have to 0-1 drink a day. ? Be aware of how much alcohol is in your drink. In the [place] one drink equals one 12 oz bottle of beer (355 mL), one 5 oz glass of wine (148 mL), or one 1 oz glass of hard liquor (44 mL). Lifestyle  Take daily care of your teeth and gums.  Stay active. Exercise for at least 30 minutes on 5 or more days each week.  Do not use any products that contain nicotine or tobacco, such as cigarettes, e-cigarettes, and chewing tobacco. If you need help quitting, ask your health care provider.  If you are sexually active, practice safe sex. Use a condom or other form of birth control (contraception) in order to prevent pregnancy and STIs (sexually transmitted infections). If you plan to become pregnant, see your health care provider for a preconception visit. What's next?  Visit your health care provider once a year for a well check visit.  Ask your health care provider how often you should have your eyes and teeth checked.  Stay up to date on all vaccines. This information is not intended to replace advice given to you by your health care provider. Make sure you discuss any questions you have with your health care provider. Document Revised: 06/08/2018 Document Reviewed: 06/08/2018 Elsevier Patient Education  2020 [employer].

## 2020-05-20 ENCOUNTER — Other Ambulatory Visit: Payer: 59

## 2020-05-20 ENCOUNTER — Other Ambulatory Visit: Payer: Self-pay

## 2020-05-20 DIAGNOSIS — Z0000 Encounter for general adult medical examination without abnormal findings: Secondary | ICD-10-CM

## 2020-05-21 LAB — CBC WITH DIFFERENTIAL/PLATELET
Basophils Absolute: 0 10*3/uL (ref 0.0–0.2)
Basos: 0 %
EOS (ABSOLUTE): 0.1 10*3/uL (ref 0.0–0.4)
Eos: 3 %
Hematocrit: 38.7 % (ref 34.0–46.6)
Hemoglobin: 12.7 g/dL (ref 11.1–15.9)
Immature Grans (Abs): 0 10*3/uL (ref 0.0–0.1)
Immature Granulocytes: 0 %
Lymphocytes Absolute: 2.3 10*3/uL (ref 0.7–3.1)
Lymphs: 42 %
MCH: 31.1 pg (ref 26.6–33.0)
MCHC: 32.8 g/dL (ref 31.5–35.7)
MCV: 95 fL (ref 79–97)
Monocytes Absolute: 0.4 10*3/uL (ref 0.1–0.9)
Monocytes: 7 %
Neutrophils Absolute: 2.6 10*3/uL (ref 1.4–7.0)
Neutrophils: 48 %
Platelets: 297 10*3/uL (ref 150–450)
RBC: 4.08 x10E6/uL (ref 3.77–5.28)
RDW: 12.3 % (ref 11.7–15.4)
WBC: 5.4 10*3/uL (ref 3.4–10.8)

## 2020-05-21 LAB — COMPREHENSIVE METABOLIC PANEL
ALT: 14 IU/L (ref 0–32)
AST: 13 IU/L (ref 0–40)
Albumin/Globulin Ratio: 1.9 (ref 1.2–2.2)
Albumin: 4.7 g/dL (ref 3.8–4.8)
Alkaline Phosphatase: 59 IU/L (ref 48–121)
BUN/Creatinine Ratio: 18 (ref 9–23)
BUN: 13 mg/dL (ref 6–20)
Bilirubin Total: 0.5 mg/dL (ref 0.0–1.2)
CO2: 23 mmol/L (ref 20–29)
Calcium: 9.4 mg/dL (ref 8.7–10.2)
Chloride: 104 mmol/L (ref 96–106)
Creatinine, Ser: 0.74 mg/dL (ref 0.57–1.00)
GFR calc Af Amer: 124 mL/min/{1.73_m2} (ref >59)
GFR calc non Af Amer: 108 mL/min/{1.73_m2} (ref >59)
Globulin, Total: 2.5 g/dL (ref 1.5–4.5)
Glucose: 88 mg/dL (ref 65–99)
Potassium: 4.4 mmol/L (ref 3.5–5.2)
Sodium: 137 mmol/L (ref 134–144)
Total Protein: 7.2 g/dL (ref 6.0–8.5)

## 2020-05-21 LAB — LIPID PANEL
Chol/HDL Ratio: 2.3 ratio (ref 0.0–4.4)
Cholesterol, Total: 152 mg/dL (ref 100–199)
HDL: 67 mg/dL (ref >39)
LDL Chol Calc (NIH): 72 mg/dL (ref 0–99)
Triglycerides: 64 mg/dL (ref 0–149)
VLDL Cholesterol Cal: 13 mg/dL (ref 5–40)

## 2020-05-21 LAB — TSH: TSH: 1.15 u[IU]/mL (ref 0.450–4.500)

## 2020-05-21 LAB — HEMOGLOBIN A1C
Est. average glucose Bld gHb Est-mCnc: 91 mg/dL
Hgb A1c MFr Bld: 4.8 % (ref 4.8–5.6)

## 2021-02-25 ENCOUNTER — Telehealth: Payer: Self-pay | Admitting: Physician Assistant

## 2021-02-25 NOTE — Telephone Encounter (Signed)
Patient called with an inquiry to last Tdp, was it within the last 10 years, according to record, next one is due 01/2026- answered yes, 2017. Thank you

## 2021-07-31 ENCOUNTER — Other Ambulatory Visit (HOSPITAL_COMMUNITY)
Admission: RE | Admit: 2021-07-31 | Discharge: 2021-07-31 | Disposition: A | Discharge status: Home / Self Care | Payer: 59 | Source: Ambulatory Visit | Attending: Physician Assistant | Admitting: Physician Assistant

## 2021-07-31 ENCOUNTER — Other Ambulatory Visit: Payer: Self-pay

## 2021-07-31 ENCOUNTER — Encounter: Payer: Self-pay | Admitting: Physician Assistant

## 2021-07-31 ENCOUNTER — Ambulatory Visit (INDEPENDENT_AMBULATORY_CARE_PROVIDER_SITE_OTHER): Payer: 59 | Admitting: Physician Assistant

## 2021-07-31 VITALS — BP 107/73 | HR 78 | Temp 98.2°F | Ht 60.0 in | Wt 120.0 lb

## 2021-07-31 DIAGNOSIS — Z0000 Encounter for general adult medical examination without abnormal findings: Secondary | ICD-10-CM | POA: Diagnosis not present

## 2021-07-31 DIAGNOSIS — Z13228 Encounter for screening for other metabolic disorders: Secondary | ICD-10-CM

## 2021-07-31 DIAGNOSIS — Z124 Encounter for screening for malignant neoplasm of cervix: Secondary | ICD-10-CM

## 2021-07-31 DIAGNOSIS — Z1329 Encounter for screening for other suspected endocrine disorder: Secondary | ICD-10-CM | POA: Diagnosis not present

## 2021-07-31 DIAGNOSIS — Z1321 Encounter for screening for nutritional disorder: Secondary | ICD-10-CM

## 2021-07-31 DIAGNOSIS — Z23 Encounter for immunization: Secondary | ICD-10-CM

## 2021-07-31 DIAGNOSIS — Z130 Encounter for screening for diseases of the blood and blood-forming organs and certain disorders involving the immune mechanism: Secondary | ICD-10-CM

## 2021-07-31 NOTE — Progress Notes (Signed)
Subjective:     Stephanie Gomez is a 35 y.o. female and is here for a comprehensive physical exam. The patient reports no problems.  Social History   Socioeconomic History   Marital status: Married    Spouse name: Not on file   Number of children: Not on file   Years of education: Not on file   Highest education level: Not on file  Occupational History   Occupation: VET. TECHNICIAN    Employer: [employer]  Tobacco Use   Smoking status: Never   Smokeless tobacco: Never  Vaping Use   Vaping Use: Never used  Substance and Sexual Activity   Alcohol use: Yes    Alcohol/week: 4.0 standard drinks    Types: 4 Cans of beer per week   Drug use: No   Sexual activity: Yes    Birth control/protection: Condom    Comment: Not currently using either  Other Topics Concern   Not on file  Social History Narrative   She is married.  Works as a Museum/gallery conservator.  Has a 29 year old daughter.   Never smoked.  No alcohol.  No caffeine.   Social Determinants of Health   Financial Resource Strain: Not on file  Food Insecurity: Not on file  Transportation Needs: Not on file  Physical Activity: Not on file  Stress: Not on file  Social Connections: Not on file  Intimate Partner Violence: Not on file   Health Maintenance  Topic Date Due   Hepatitis C Screening  Never done   PAP SMEAR-Modifier  11/12/2019   COVID-19 Vaccine (3 - Booster for Pfizer series) 03/10/2020   TETANUS/TDAP  02/26/2031   INFLUENZA VACCINE  Completed   HIV Screening  Completed   Pneumococcal Vaccine 64-80 Years old  Aged Out   HPV VACCINES  Aged Out    The following portions of the patient's history were reviewed and updated as appropriate: allergies, current medications, past family history, past medical history, past social history, past surgical history, and problem list.  Review of Systems Pertinent items noted in HPI and remainder of comprehensive ROS otherwise negative.   Objective:    BP 107/73   Pulse 78    Temp 98.2 F (36.8 C)   Ht 5' (1.524 m)   Wt 120 lb (54.4 kg)   SpO2 99%   BMI 23.44 kg/m  General appearance: alert, cooperative, and no distress Head: Normocephalic, without obvious abnormality, atraumatic Eyes: conjunctivae/corneas clear. PERRL, EOM's intact. Fundi benign. Ears: normal TM's and external ear canals both ears Nose: Nares normal. Septum midline. Mucosa normal. No drainage or sinus tenderness. Throat: lips, mucosa, and tongue normal; teeth and gums normal Neck: no adenopathy, supple, symmetrical, trachea midline, and thyroid: normal to inspection and palpation Back: symmetric, no curvature. ROM normal. No CVA tenderness. Lungs: clear to auscultation bilaterally Breasts: Inspection negative Heart: regular rate and rhythm, S1, S2 normal, no murmur, click, rub or gallop Abdomen: soft, non-tender; bowel sounds normal; no masses,  no organomegaly Pelvic: cervix normal in appearance, external genitalia normal, no adnexal masses or tenderness, no cervical motion tenderness, positive findings: vaginal discharge:  white, and uterus normal size, shape, and consistency Extremities: extremities normal, atraumatic, no cyanosis or edema Pulses: 2+ and symmetric Skin: Skin color, texture, turgor normal. No rashes or lesions Lymph nodes: Cervical adenopathy: normal and Supraclavicular adenopathy: normal Neurologic: Grossly normal    Assessment:    Healthy female exam.     Plan:  -Will obtain non-fasting labs. -Will  notify of pap results once resulted. -Patient agreeable to influenza vaccine. -Continue exercise regimen and follow a heart healthy diet. -Follow up in 1 year for CPE and FBW  See After Visit Summary for Counseling Recommendations

## 2021-07-31 NOTE — Patient Instructions (Signed)
Preventive Care 21-34 Years Old, Female Preventive care refers to lifestyle choices and visits with your health care provider that can promote health and wellness. This includes: A yearly physical exam. This is also called an annual wellness visit. Regular dental and eye exams. Immunizations. Screening for certain conditions. Healthy lifestyle choices, such as: Eating a healthy diet. Getting regular exercise. Not using drugs or products that contain nicotine and tobacco. Limiting alcohol use. What can I expect for my preventive care visit? Physical exam Your health care provider may check your: Height and weight. These may be used to calculate your BMI (body mass index). BMI is a measurement that tells if you are at a healthy weight. Heart rate and blood pressure. Body temperature. Skin for abnormal spots. Counseling Your health care provider may ask you questions about your: Past medical problems. Family's medical history. Alcohol, tobacco, and drug use. Emotional well-being. Home life and relationship well-being. Sexual activity. Diet, exercise, and sleep habits. Work and work environment. Access to firearms. Method of birth control. Menstrual cycle. Pregnancy history. What immunizations do I need? Vaccines are usually given at various ages, according to a schedule. Your health care provider will recommend vaccines for you based on your age, medical history, and lifestyle or other factors, such as travel or where you work. What tests do I need? Blood tests Lipid and cholesterol levels. These may be checked every 5 years starting at age 20. Hepatitis C test. Hepatitis B test. Screening Diabetes screening. This is done by checking your blood sugar (glucose) after you have not eaten for a while (fasting). STD (sexually transmitted disease) testing, if you are at risk. BRCA-related cancer screening. This may be done if you have a family history of breast, ovarian, tubal, or  peritoneal cancers. Pelvic exam and Pap test. This may be done every 3 years starting at age 21. Starting at age 30, this may be done every 5 years if you have a Pap test in combination with an HPV test. Talk with your health care provider about your test results, treatment options, and if necessary, the need for more tests. Follow these instructions at home: Eating and drinking  Eat a healthy diet that includes fresh fruits and vegetables, whole grains, lean protein, and low-fat dairy products. Take vitamin and mineral supplements as recommended by your health care provider. Do not drink alcohol if: Your health care provider tells you not to drink. You are pregnant, may be pregnant, or are planning to become pregnant. If you drink alcohol: Limit how much you have to 0-1 drink a day. Be aware of how much alcohol is in your drink. In the [place] one drink equals one 12 oz bottle of beer (355 mL), one 5 oz glass of wine (148 mL), or one 1 oz glass of hard liquor (44 mL). Lifestyle Take daily care of your teeth and gums. Brush your teeth every morning and night with fluoride toothpaste. Floss one time each day. Stay active. Exercise for at least 30 minutes 5 or more days each week. Do not use any products that contain nicotine or tobacco, such as cigarettes, e-cigarettes, and chewing tobacco. If you need help quitting, ask your health care provider. Do not use drugs. If you are sexually active, practice safe sex. Use a condom or other form of protection to prevent STIs (sexually transmitted infections). If you do not wish to become pregnant, use a form of birth control. If you plan to become pregnant, see your health care provider   for a prepregnancy visit. Find healthy ways to cope with stress, such as: Meditation, yoga, or listening to music. Journaling. Talking to a trusted person. Spending time with friends and family. Safety Always wear your seat belt while driving or riding in a  vehicle. Do not drive: If you have been drinking alcohol. Do not ride with someone who has been drinking. When you are tired or distracted. While texting. Wear a helmet and other protective equipment during sports activities. If you have firearms in your house, make sure you follow all gun safety procedures. Seek help if you have been physically or sexually abused. What's next? Go to your health care provider once a year for an annual wellness visit. Ask your health care provider how often you should have your eyes and teeth checked. Stay up to date on all vaccines. This information is not intended to replace advice given to you by your health care provider. Make sure you discuss any questions you have with your health care provider. Document Revised: 12/05/2020 Document Reviewed: 06/08/2018 Elsevier Patient Education  2022 [employer] Inc[employer]  

## 2021-08-01 LAB — CBC WITH DIFFERENTIAL/PLATELET
Basophils Absolute: 0 10*3/uL (ref 0.0–0.2)
Basos: 0 %
EOS (ABSOLUTE): 0.1 10*3/uL (ref 0.0–0.4)
Eos: 1 %
Hematocrit: 40 % (ref 34.0–46.6)
Hemoglobin: 13.2 g/dL (ref 11.1–15.9)
Immature Grans (Abs): 0 10*3/uL (ref 0.0–0.1)
Immature Granulocytes: 0 %
Lymphocytes Absolute: 2.2 10*3/uL (ref 0.7–3.1)
Lymphs: 24 %
MCH: 30.3 pg (ref 26.6–33.0)
MCHC: 33 g/dL (ref 31.5–35.7)
MCV: 92 fL (ref 79–97)
Monocytes Absolute: 0.5 10*3/uL (ref 0.1–0.9)
Monocytes: 6 %
Neutrophils Absolute: 6.6 10*3/uL (ref 1.4–7.0)
Neutrophils: 69 %
Platelets: 386 10*3/uL (ref 150–450)
RBC: 4.35 x10E6/uL (ref 3.77–5.28)
RDW: 12 % (ref 11.7–15.4)
WBC: 9.6 10*3/uL (ref 3.4–10.8)

## 2021-08-01 LAB — COMPREHENSIVE METABOLIC PANEL
ALT: 21 IU/L (ref 0–32)
AST: 25 IU/L (ref 0–40)
Albumin/Globulin Ratio: 1.8 (ref 1.2–2.2)
Albumin: 4.7 g/dL (ref 3.8–4.8)
Alkaline Phosphatase: 76 IU/L (ref 44–121)
BUN/Creatinine Ratio: 15 (ref 9–23)
BUN: 10 mg/dL (ref 6–20)
Bilirubin Total: 0.4 mg/dL (ref 0.0–1.2)
CO2: 22 mmol/L (ref 20–29)
Calcium: 9.4 mg/dL (ref 8.7–10.2)
Chloride: 101 mmol/L (ref 96–106)
Creatinine, Ser: 0.65 mg/dL (ref 0.57–1.00)
Globulin, Total: 2.6 g/dL (ref 1.5–4.5)
Glucose: 67 mg/dL — ABNORMAL LOW (ref 70–99)
Potassium: 4 mmol/L (ref 3.5–5.2)
Sodium: 138 mmol/L (ref 134–144)
Total Protein: 7.3 g/dL (ref 6.0–8.5)
eGFR: 118 mL/min/{1.73_m2} (ref >59)

## 2021-08-01 LAB — LDL CHOLESTEROL, DIRECT: LDL Direct: 56 mg/dL (ref 0–99)

## 2021-08-01 LAB — HEMOGLOBIN A1C
Est. average glucose Bld gHb Est-mCnc: 94 mg/dL
Hgb A1c MFr Bld: 4.9 % (ref 4.8–5.6)

## 2021-08-01 LAB — TSH: TSH: 1.09 u[IU]/mL (ref 0.450–4.500)

## 2021-08-04 ENCOUNTER — Encounter: Payer: Self-pay | Admitting: Physician Assistant

## 2021-08-04 LAB — CYTOLOGY - PAP
Comment: NEGATIVE
Diagnosis: NEGATIVE
High risk HPV: NEGATIVE

## 2022-07-07 ENCOUNTER — Ambulatory Visit
Admission: EM | Admit: 2022-07-07 | Discharge: 2022-07-07 | Disposition: A | Discharge status: Home / Self Care | Payer: 59 | Attending: Internal Medicine | Admitting: Internal Medicine

## 2022-07-07 DIAGNOSIS — S61210A Laceration without foreign body of right index finger without damage to nail, initial encounter: Secondary | ICD-10-CM | POA: Diagnosis not present

## 2022-07-07 NOTE — Discharge Instructions (Signed)
Dressing has been applied.  Please wash daily with Dial soap and apply antibiotic ointment and dressing daily and as needed.  Monitor for signs of infection that include increased redness, swelling, pus and follow-up if these occur.

## 2022-07-07 NOTE — ED Provider Notes (Signed)
[facility]    CSN: [id number] Arrival date & time: 07/07/22  1941      History   Chief Complaint Chief Complaint  Patient presents with   finger in cheese grader    HPI Stephanie Gomez is a 36 y.o. female.   Patient presents with laceration to right index finger that occurred about 1 to 2 hours prior to arrival to urgent care.  Patient reports that she was using a cheese grater when it slipped and cut her finger.  Patient reports that she rinsed finger with water.  Last tetanus shot was about a year ago per patient.  Denies numbness or tingling.  Patient has full range of motion of finger. Patient does not take blood thinners.      Past Medical History:  Diagnosis Date   Anxiety    patient-activated cardiac event monitor 11/2017   Mostly NSR (some brady & tachy) - rates 45-142 bpm.  Rare PVCs (singles - no couplets, runs or bi-trigeminy), No PACs, PAT or arrhythmia such as SVT, A. fib or a flutter.   PONV (postoperative nausea and vomiting)     Patient Active Problem List   Diagnosis Date Noted   Acute viral syndrome 05/15/2020   Healthcare maintenance 03/01/2019   Pain in right knee 04/28/2018   PVC's (premature ventricular contractions) 11/29/2017   Sensation of chest tightness 11/29/2017   Vaginal delivery 02/27/2016   Obesity (BMI 30.0-34.9) 02/26/2016   Allergy to penicillin 02/26/2016   Anxiety 12/15/2015    Past Surgical History:  Procedure Laterality Date   NASAL FRACTURE SURGERY     TRANSTHORACIC ECHOCARDIOGRAM  11/2017   EF 50 and 55% with no regional Carneal motion normality.  Normal valves.  No mitral prolapse.   WISDOM TOOTH EXTRACTION      OB History     Gravida  1   Para  1   Term  1   Preterm      AB      Living  1      SAB      IAB      Ectopic      Multiple  0   Live Births  1            Home Medications    Prior to Admission medications   Medication Sig Start Date End Date Taking? Authorizing Provider   cetirizine (ZYRTEC) 10 MG tablet Take 10 mg by mouth daily.    [provider]  Cholecalciferol (VITAMIN D3) 125 MCG (5000 UT) CAPS Take 1 capsule by mouth daily.    [provider]  Multiple Vitamin (MULTIVITAMIN) tablet Take 1 tablet by mouth daily.    [provider]  vitamin C (ASCORBIC ACID) 250 MG tablet Take 250 mg by mouth daily.    [provider]    Family History Family History  Adopted: Yes  Problem Relation Age of Onset   Seizures Mother        She is not aware of any details.  This is her birth mother, and she has limited information   Hyperlipidemia Mother    Hypertension Mother    Skin cancer Maternal Grandfather    Skin cancer Paternal Grandfather     Social History Social History   Tobacco Use   Smoking status: Never   Smokeless tobacco: Never  Vaping Use   Vaping Use: Never used  Substance Use Topics   Alcohol use: Yes    Alcohol/week: 4.0  standard drinks of alcohol    Types: 4 Cans of beer per week   Drug use: No     Allergies   Amoxil [amoxicillin], Doxycycline, and Penicillins   Review of Systems Review of Systems Per HPI  Physical Exam Triage Vital Signs ED Triage Vitals  Enc Vitals Group     BP 07/07/22 1952 127/88     Pulse Rate 07/07/22 1952 74     Resp 07/07/22 1952 16     Temp 07/07/22 1952 98.1 F (36.7 C)     Temp Source 07/07/22 1952 Oral     SpO2 07/07/22 1952 98 %     Weight --      Height --      Head Circumference --      Peak Flow --      Pain Score 07/07/22 1953 5     Pain Loc --      Pain Edu? --      Excl. in [place]? --    No data found.  Updated Vital Signs BP 127/88 (BP Location: Right Arm)   Pulse 74   Temp 98.1 F (36.7 C) (Oral)   Resp 16   SpO2 98%   Visual Acuity Right Eye Distance:   Left Eye Distance:   Bilateral Distance:    Right Eye Near:   Left Eye Near:    Bilateral Near:     Physical Exam Constitutional:      General: She is not in acute  distress.    Appearance: Normal appearance. She is not toxic-appearing or diaphoretic.  HENT:     Head: Normocephalic and atraumatic.  Eyes:     Extraocular Movements: Extraocular movements intact.     Conjunctiva/sclera: Conjunctivae normal.  Pulmonary:     Effort: Pulmonary effort is normal.  Musculoskeletal:     Comments: Patient has full range of motion of right index finger.  Grip strength 5/5.  Skin:    Comments: Approximately 1 to 1.5 cm in diameter avulsion laceration present to dorsal surface of right index finger overlying proximal interphalangeal joint.  Bleeding is controlled.  Capillary refill and pulses normal.  Neurological:     General: No focal deficit present.     Mental Status: She is alert and oriented to person, place, and time. Mental status is at baseline.  Psychiatric:        Mood and Affect: Mood normal.        Behavior: Behavior normal.        Thought Content: Thought content normal.        Judgment: Judgment normal.      UC Treatments / Results  Labs (all labs ordered are listed, but only abnormal results are displayed) Labs Reviewed - No data to display  EKG   Radiology No results found.  Procedures Procedures (including critical care time)  Medications Ordered in UC Medications - No data to display  Initial Impression / Assessment and Plan / UC Course  I have reviewed the triage vital signs and the nursing notes.  Pertinent labs & imaging results that were available during my care of the patient were reviewed by me and considered in my medical decision making (see chart for details).     Patient has avulsion fracture to right index finger.  No sutures or closure needed given that skin was basically peeled off.  Bleeding is controlled and stopped. Advised patient of wound care and daily dressing changes.  Nurse cleaned wound, applied antibiotic ointment,  and applied dressing prior to discharge.  Tetanus vaccine is up-to-date per patient.   Advised to monitor for signs of infection to follow-up if this occurs.  Discussed return precautions.  Patient verbalized understanding and was agreeable with plan. Final Clinical Impressions(s) / UC Diagnoses   Final diagnoses:  Laceration of right index finger without foreign body without damage to nail, initial encounter     Discharge Instructions      Dressing has been applied.  Please wash daily with Dial soap and apply antibiotic ointment and dressing daily and as needed.  Monitor for signs of infection that include increased redness, swelling, pus and follow-up if these occur.    ED Prescriptions   None    PDMP not reviewed this encounter.   Gustavus Bryant, [place] 07/07/22 2008

## 2022-07-07 NOTE — ED Triage Notes (Addendum)
Pt c/o cutting right pointer finger in a cheese grader about 1-2 hours ago.

## 2022-08-06 ENCOUNTER — Ambulatory Visit (INDEPENDENT_AMBULATORY_CARE_PROVIDER_SITE_OTHER): Payer: 59 | Admitting: Physician Assistant

## 2022-08-06 ENCOUNTER — Encounter: Payer: Self-pay | Admitting: Physician Assistant

## 2022-08-06 VITALS — BP 95/60 | HR 66 | Wt 124.1 lb

## 2022-08-06 DIAGNOSIS — Z1321 Encounter for screening for nutritional disorder: Secondary | ICD-10-CM | POA: Diagnosis not present

## 2022-08-06 DIAGNOSIS — Z130 Encounter for screening for diseases of the blood and blood-forming organs and certain disorders involving the immune mechanism: Secondary | ICD-10-CM

## 2022-08-06 DIAGNOSIS — Z1329 Encounter for screening for other suspected endocrine disorder: Secondary | ICD-10-CM | POA: Diagnosis not present

## 2022-08-06 DIAGNOSIS — T162XXA Foreign body in left ear, initial encounter: Secondary | ICD-10-CM

## 2022-08-06 DIAGNOSIS — Z13228 Encounter for screening for other metabolic disorders: Secondary | ICD-10-CM

## 2022-08-06 DIAGNOSIS — Z0000 Encounter for general adult medical examination without abnormal findings: Secondary | ICD-10-CM

## 2022-08-06 NOTE — Patient Instructions (Signed)

## 2022-08-06 NOTE — Progress Notes (Signed)
Complete physical exam   Patient: Stephanie Gomez   DOB: Sep 07, 1986   36 y.o. Female  MRN: [id number] Visit Date: 08/06/2022   Chief Complaint  Patient presents with   Annual Exam   Subjective    Stephanie Gomez is a 35 y.o. female who presents today for a complete physical exam.  She reports consuming a general diet.  Yoga and HIT exercises   She generally feels well. She does not have additional problems to discuss today.     Past Medical History:  Diagnosis Date   Anxiety    patient-activated cardiac event monitor 11/2017   Mostly NSR (some brady & tachy) - rates 45-142 bpm.  Rare PVCs (singles - no couplets, runs or bi-trigeminy), No PACs, PAT or arrhythmia such as SVT, A. fib or a flutter.   PONV (postoperative nausea and vomiting)    Past Surgical History:  Procedure Laterality Date   NASAL FRACTURE SURGERY     TRANSTHORACIC ECHOCARDIOGRAM  11/2017   EF 50 and 55% with no regional [place] motion normality.  Normal valves.  No mitral prolapse.   WISDOM TOOTH EXTRACTION     Social History   Socioeconomic History   Marital status: Married    Spouse name: Not on file   Number of children: Not on file   Years of education: Not on file   Highest education level: Not on file  Occupational History   Occupation: VET. TECHNICIAN    Employer: [employer]  Tobacco Use   Smoking status: Never   Smokeless tobacco: Never  Vaping Use   Vaping Use: Never used  Substance and Sexual Activity   Alcohol use: Yes    Alcohol/week: 4.0 standard drinks of alcohol    Types: 4 Cans of beer per week   Drug use: No   Sexual activity: Yes    Birth control/protection: Condom    Comment: Not currently using either  Other Topics Concern   Not on file  Social History Narrative   She is married.  Works as a Camera operator.  Has a 13 year old daughter.   Never smoked.  No alcohol.  No caffeine.   Social Determinants of Health   Financial Resource Strain: Not on file  Food Insecurity:  Not on file  Transportation Needs: Not on file  Physical Activity: Not on file  Stress: Not on file  Social Connections: Not on file  Intimate Partner Violence: Not on file     Medications: Outpatient Medications Prior to Visit  Medication Sig   cetirizine (ZYRTEC) 10 MG tablet Take 10 mg by mouth daily.   Cholecalciferol (VITAMIN D3) 125 MCG (5000 UT) CAPS Take 1 capsule by mouth daily.   Multiple Vitamin (MULTIVITAMIN) tablet Take 1 tablet by mouth daily.   vitamin C (ASCORBIC ACID) 250 MG tablet Take 250 mg by mouth daily.   No facility-administered medications prior to visit.    Review of Systems  Last CBC Lab Results  Component Value Date   WBC 9.6 07/31/2021   HGB 13.2 07/31/2021   HCT 40.0 07/31/2021   MCV 92 07/31/2021   MCH 30.3 07/31/2021   RDW 12.0 07/31/2021   PLT 386 35/32/9924   Last metabolic panel Lab Results  Component Value Date   GLUCOSE 67 (L) 07/31/2021   NA 138 07/31/2021   K 4.0 07/31/2021   CL 101 07/31/2021   CO2 22 07/31/2021   BUN 10 07/31/2021   CREATININE 0.65 07/31/2021   EGFR  118 07/31/2021   CALCIUM 9.4 07/31/2021   PROT 7.3 07/31/2021   ALBUMIN 4.7 07/31/2021   LABGLOB 2.6 07/31/2021   AGRATIO 1.8 07/31/2021   BILITOT 0.4 07/31/2021   ALKPHOS 76 07/31/2021   AST 25 07/31/2021   ALT 21 07/31/2021   Last lipids Lab Results  Component Value Date   CHOL 152 05/20/2020   HDL 67 05/20/2020   LDLCALC 72 05/20/2020   LDLDIRECT 56 07/31/2021   TRIG 64 05/20/2020   CHOLHDL 2.3 05/20/2020   Last hemoglobin A1c Lab Results  Component Value Date   HGBA1C 4.9 07/31/2021   Last thyroid functions Lab Results  Component Value Date   TSH 1.090 07/31/2021   Last vitamin D No results found for: "25OHVITD2", "25OHVITD3", "VD25OH"  Objective    BP 95/60   Pulse 66   Wt 124 lb 1.9 oz (56.3 kg)   LMP 07/12/2022   SpO2 100%   BMI 24.24 kg/m  BP Readings from Last 3 Encounters:  08/06/22 95/60  07/07/22 127/88  07/31/21  107/73   Wt Readings from Last 3 Encounters:  08/06/22 124 lb 1.9 oz (56.3 kg)  07/31/21 120 lb (54.4 kg)  05/15/20 129 lb 1.6 oz (58.6 kg)    Physical Exam   General Appearance:    Well developed, well nourished female. Alert, cooperative, in no acute distress, appears stated age   Head:    Normocephalic, without obvious abnormality, atraumatic  Eyes:    PERRL, conjunctiva/corneas clear, EOM's intact, fundi    benign, both eyes  Ears:    Normal external ear canals, both ears; yellow wool-like material of left ear partially obstructing view of TM, some fluid behind right TM noted  Nose:   Nares normal, septum midline, mucosa normal, no drainage    or sinus tenderness  Throat:   Lips, mucosa, and tongue normal; teeth and gums normal  Neck:   Supple, symmetrical, trachea midline, no adenopathy;    thyroid:  no enlargement/tenderness/nodules; no JVD  Back:     Symmetric, no curvature, ROM normal, no CVA tenderness  Lungs:     Clear to auscultation bilaterally, respirations unlabored  Chest [place]:    No tenderness or deformity   Heart:    Normal heart rate. Normal rhythm. No murmurs, rubs, or gallops.   Breast Exam:    deferred  Abdomen:     Soft, non-tender, bowel sounds active all four quadrants,    no masses, no organomegaly  Pelvic:    deferred  Extremities:   All extremities are intact. No cyanosis or edema  Pulses:   2+ and symmetric all extremities  Skin:   Skin color, texture, turgor normal, no rashes or lesions  Lymph nodes:   Cervical, supraclavicular nodes normal  Neurologic:   CNII-XII grossly intact.     Last depression screening scores    08/06/2022    8:16 AM 07/31/2021   10:19 AM 05/15/2020    3:00 PM  PHQ 2/9 Scores  PHQ - 2 Score 0 0 0  PHQ- 9 Score _0 Last fall risk screening    07/31/2021   10:18 AM  Fall Risk   Falls in the past year? 0  Number falls in past yr: 0  Injury with Fall? 0  Risk for fall due to : No Fall Risks  Follow up Falls  evaluation completed     No results found for any visits on 08/06/22.  Assessment & Plan  Routine Health Maintenance and Physical Exam  Exercise Activities and Dietary recommendations -Discussed heart healthy diet low in fat and carbohydrates. Recommend moderate exercise 150 mins/wk.  Immunization History  Administered Date(s) Administered   Influenza Split 08/05/2015   Influenza,inj,Quad PF,6+ Mos 07/31/2021   Influenza-Unspecified 07/21/2019, 07/24/2022   PFIZER(Purple Top)SARS-COV-2 Vaccination 12/21/2019, 01/14/2020   Tdap 01/15/2016, 02/25/2021    Health Maintenance  Topic Date Due   Hepatitis C Screening  Never done   COVID-19 Vaccine (3 - Pfizer series) 03/10/2020   PAP SMEAR-Modifier  07/31/2024   TETANUS/TDAP  02/26/2031   INFLUENZA VACCINE  Completed   HIV Screening  Completed   HPV VACCINES  Aged Out    Discussed health benefits of physical activity, and encouraged her to engage in regular exercise appropriate for her age and condition.  Problem List Items Addressed This Visit       Other   Healthcare maintenance - Primary   Relevant Orders   Hemoglobin A1c   Lipid panel   TSH   CBC with Differential/Platelet   Comprehensive metabolic panel   Other Visit Diagnoses     Screening for endocrine, nutritional, metabolic and immunity disorder       Foreign body of left ear, initial encounter       Relevant Orders   Ambulatory referral to ENT      Will obtain routine fasting labs. Will place ENT referral for foreign object of left ear. Pt denies ear pain or drainage. Patient obtained flu vaccine at [facility], will check [facility]. UTD pap. Follow-up in 1 year for CPE and FBW.  Return in about 1 year (around 08/07/2023) for CPE and FBW.       Lorrene Reid, PA-C  [facility] [phone] (phone) [phone] (fax)  [place]

## 2022-08-07 LAB — COMPREHENSIVE METABOLIC PANEL
ALT: 21 IU/L (ref 0–32)
AST: 16 IU/L (ref 0–40)
Albumin/Globulin Ratio: 2 (ref 1.2–2.2)
Albumin: 4.6 g/dL (ref 3.9–4.9)
Alkaline Phosphatase: 56 IU/L (ref 44–121)
BUN/Creatinine Ratio: 13 (ref 9–23)
BUN: 10 mg/dL (ref 6–20)
Bilirubin Total: 0.8 mg/dL (ref 0.0–1.2)
CO2: 20 mmol/L (ref 20–29)
Calcium: 9.4 mg/dL (ref 8.7–10.2)
Chloride: 105 mmol/L (ref 96–106)
Creatinine, Ser: 0.8 mg/dL (ref 0.57–1.00)
Globulin, Total: 2.3 g/dL (ref 1.5–4.5)
Glucose: 82 mg/dL (ref 70–99)
Potassium: 4.2 mmol/L (ref 3.5–5.2)
Sodium: 141 mmol/L (ref 134–144)
Total Protein: 6.9 g/dL (ref 6.0–8.5)
eGFR: 98 mL/min/{1.73_m2} (ref >59)

## 2022-08-07 LAB — CBC WITH DIFFERENTIAL/PLATELET
Basophils Absolute: 0 10*3/uL (ref 0.0–0.2)
Basos: 0 %
EOS (ABSOLUTE): 0.1 10*3/uL (ref 0.0–0.4)
Eos: 2 %
Hematocrit: 40.5 % (ref 34.0–46.6)
Hemoglobin: 13.1 g/dL (ref 11.1–15.9)
Immature Grans (Abs): 0 10*3/uL (ref 0.0–0.1)
Immature Granulocytes: 0 %
Lymphocytes Absolute: 2 10*3/uL (ref 0.7–3.1)
Lymphs: 35 %
MCH: 30.6 pg (ref 26.6–33.0)
MCHC: 32.3 g/dL (ref 31.5–35.7)
MCV: 95 fL (ref 79–97)
Monocytes Absolute: 0.4 10*3/uL (ref 0.1–0.9)
Monocytes: 6 %
Neutrophils Absolute: 3.2 10*3/uL (ref 1.4–7.0)
Neutrophils: 57 %
Platelets: 279 10*3/uL (ref 150–450)
RBC: 4.28 x10E6/uL (ref 3.77–5.28)
RDW: 12.4 % (ref 11.7–15.4)
WBC: 5.8 10*3/uL (ref 3.4–10.8)

## 2022-08-07 LAB — HEMOGLOBIN A1C
Est. average glucose Bld gHb Est-mCnc: 97 mg/dL
Hgb A1c MFr Bld: 5 % (ref 4.8–5.6)

## 2022-08-07 LAB — LIPID PANEL
Chol/HDL Ratio: 2 ratio (ref 0.0–4.4)
Cholesterol, Total: 150 mg/dL (ref 100–199)
HDL: 75 mg/dL (ref >39)
LDL Chol Calc (NIH): 61 mg/dL (ref 0–99)
Triglycerides: 69 mg/dL (ref 0–149)
VLDL Cholesterol Cal: 14 mg/dL (ref 5–40)

## 2022-08-07 LAB — TSH: TSH: 1.39 u[IU]/mL (ref 0.450–4.500)

## 2022-12-10 ENCOUNTER — Emergency Department (HOSPITAL_BASED_OUTPATIENT_CLINIC_OR_DEPARTMENT_OTHER)
Admission: EM | Admit: 2022-12-10 | Discharge: 2022-12-10 | Disposition: A | Discharge status: Home / Self Care | Payer: 59 | Attending: Emergency Medicine | Admitting: Emergency Medicine

## 2022-12-10 ENCOUNTER — Emergency Department (HOSPITAL_BASED_OUTPATIENT_CLINIC_OR_DEPARTMENT_OTHER): Payer: 59 | Admitting: Radiology

## 2022-12-10 ENCOUNTER — Other Ambulatory Visit: Payer: Self-pay

## 2022-12-10 ENCOUNTER — Encounter (HOSPITAL_BASED_OUTPATIENT_CLINIC_OR_DEPARTMENT_OTHER): Payer: Self-pay

## 2022-12-10 DIAGNOSIS — M545 Low back pain, unspecified: Secondary | ICD-10-CM | POA: Diagnosis present

## 2022-12-10 DIAGNOSIS — Y92410 Unspecified street and highway as the place of occurrence of the external cause: Secondary | ICD-10-CM | POA: Insufficient documentation

## 2022-12-10 LAB — PREGNANCY, URINE: Preg Test, Ur: NEGATIVE

## 2022-12-10 MED ORDER — CYCLOBENZAPRINE HCL 10 MG PO TABS: 10.0000 mg | ORAL_TABLET | Freq: Two times a day (BID) | ORAL | 0 refills | Status: DC | PRN
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
## 2022-12-10 NOTE — Discharge Instructions (Signed)
Please pick up the Flexeril I prescribed for you and take at night.  This medication may make you drowsy so please do not drive or operate machinery afterwards.  He may remain active as tolerated however no heavy lifting.  You may continue to take ibuprofen or Tylenol every 6 hours as needed for pain.  If symptoms worsen please return to ER.

## 2022-12-15 ENCOUNTER — Encounter: Payer: Self-pay | Admitting: Family Medicine

## 2022-12-15 ENCOUNTER — Ambulatory Visit (INDEPENDENT_AMBULATORY_CARE_PROVIDER_SITE_OTHER): Payer: 59 | Admitting: Family Medicine

## 2022-12-15 DIAGNOSIS — G8911 Acute pain due to trauma: Secondary | ICD-10-CM | POA: Diagnosis not present

## 2022-12-15 DIAGNOSIS — M545 Low back pain, unspecified: Secondary | ICD-10-CM | POA: Diagnosis not present

## 2022-12-15 NOTE — Patient Instructions (Signed)
Start the Flexeril once per day, and if you are tolerating it well you can increase it to twice a day. Continue alternating 1000 mg of Tylenol and 400 mg of ibuprofen every 6 hours or so.
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
## 2023-01-26 ENCOUNTER — Ambulatory Visit (INDEPENDENT_AMBULATORY_CARE_PROVIDER_SITE_OTHER): Payer: 59 | Admitting: Family Medicine

## 2023-01-26 ENCOUNTER — Encounter: Payer: Self-pay | Admitting: Family Medicine

## 2023-01-26 DIAGNOSIS — M545 Low back pain, unspecified: Secondary | ICD-10-CM

## 2023-01-26 NOTE — Patient Instructions (Signed)
I am glad that you are doing better and I will see you in October!  In the meantime, sending message on MyChart if you do want a referral to PT.
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
## 2023-08-08 ENCOUNTER — Ambulatory Visit (INDEPENDENT_AMBULATORY_CARE_PROVIDER_SITE_OTHER): Payer: Managed Care, Other (non HMO) | Admitting: Family Medicine

## 2023-08-08 ENCOUNTER — Encounter: Payer: Self-pay | Admitting: Family Medicine

## 2023-08-08 VITALS — BP 122/69 | HR 61 | Resp 18 | Ht 59.5 in | Wt 130.0 lb

## 2023-08-08 DIAGNOSIS — Z1159 Encounter for screening for other viral diseases: Secondary | ICD-10-CM

## 2023-08-08 DIAGNOSIS — Z1329 Encounter for screening for other suspected endocrine disorder: Secondary | ICD-10-CM

## 2023-08-08 DIAGNOSIS — Z131 Encounter for screening for diabetes mellitus: Secondary | ICD-10-CM | POA: Diagnosis not present

## 2023-08-08 DIAGNOSIS — Z23 Encounter for immunization: Secondary | ICD-10-CM

## 2023-08-08 DIAGNOSIS — Z136 Encounter for screening for cardiovascular disorders: Secondary | ICD-10-CM | POA: Diagnosis not present

## 2023-08-08 DIAGNOSIS — F419 Anxiety disorder, unspecified: Secondary | ICD-10-CM

## 2023-08-08 DIAGNOSIS — Z0000 Encounter for general adult medical examination without abnormal findings: Secondary | ICD-10-CM

## 2023-08-08 DIAGNOSIS — R5383 Other fatigue: Secondary | ICD-10-CM

## 2023-08-08 NOTE — Progress Notes (Signed)
Complete physical exam  Patient: Stephanie Gomez   DOB: 18-Jun-1986   37 y.o. Female  MRN: [id number]  Subjective:    Chief Complaint  Patient presents with   Annual Exam    Fasting    Stephanie Gomez is a 37 y.o. female who presents today for a complete physical exam. She reports consuming a  balanced  diet and notes that she does not eat much red meat.  She does a yoga/HIIT workout about 3 times a week which gives her some time for self-care and gives her 75-year-old tablet time.  She generally feels well. She reports sleeping poorly at times, it seems to be cyclical.  She is not sure if it is associated with her menstrual cycle.  She only struggles with sleep maintenance, occasionally waking up and having trouble going back to sleep.  While she is awake, she sometimes experiences a racing heart.  She has known history of PVCs and this does not feel the same.  She uses exercises to reduce anxiety during this time. She does not have additional problems to discuss today.    Most recent fall risk assessment:    12/15/2022   11:19 AM  Fall Risk   Falls in the past year? 0  Number falls in past yr: 0  Injury with Fall? 0     Most recent depression and anxiety screenings:    08/08/2023    8:22 AM 01/26/2023    4:05 PM  PHQ 2/9 Scores  PHQ - 2 Score 0 0  PHQ- 9 Score 3 1      08/08/2023    8:22 AM 01/26/2023    4:06 PM 08/06/2022    8:16 AM 07/31/2021   10:19 AM  GAD 7 : Generalized Anxiety Score  Nervous, Anxious, on Edge 1 0 1 1  Control/stop worrying 0 0 0 0  Worry too much - different things 0 0 0 0  Trouble relaxing 1 1 0 0  Restless 0 0 0 0  Easily annoyed or irritable 1 1 0 0  Afraid - awful might happen 0 0 0 0  Total GAD 7 Score 3 2 1 1   Anxiety Difficulty Not difficult at all Not difficult at all  Not difficult at all    Patient Active Problem List   Diagnosis Date Noted   PVC's (premature ventricular contractions) 11/29/2017   Anxiety 12/15/2015    Past  Surgical History:  Procedure Laterality Date   FRACTURE SURGERY     Nose fracture   NASAL FRACTURE SURGERY     TRANSTHORACIC ECHOCARDIOGRAM  11/2017   EF 50 and 55% with no regional Gullatt motion normality.  Normal valves.  No mitral prolapse.   WISDOM TOOTH EXTRACTION     Social History   Tobacco Use   Smoking status: Never    Passive exposure: Never   Smokeless tobacco: Never  Vaping Use   Vaping status: Never Used  Substance Use Topics   Alcohol use: Yes    Alcohol/week: 7.0 standard drinks of alcohol    Types: 7 Cans of beer per week   Drug use: No   Family History  Adopted: Yes  Problem Relation Age of Onset   Seizures Mother        She is not aware of any details.  This is her birth mother, and she has limited information   Hyperlipidemia Mother    Hypertension Mother    Skin cancer Maternal Grandfather  Cancer Maternal Grandfather    Skin cancer Paternal Grandfather    Cancer Paternal Grandmother    Allergies  Allergen Reactions   Amoxil [Amoxicillin] Hives    Has patient had a PCN reaction causing immediate rash, facial/tongue/throat swelling, SOB or lightheadedness with hypotension: Yes Has patient had a PCN reaction causing severe rash involving mucus membranes or skin necrosis: Yes Has patient had a PCN reaction that required hospitalization No Has patient had a PCN reaction occurring within the last 10 years: No If all of the above answers are "NO", then may proceed with Cephalosporin use.    Doxycycline Other (See Comments)    Vision bluriness, light headedness    Penicillins Hives    Has patient had a PCN reaction causing immediate rash, facial/tongue/throat swelling, SOB or lightheadedness with hypotension: Yes Has patient had a PCN reaction causing severe rash involving mucus membranes or skin necrosis: Yes Has patient had a PCN reaction that required hospitalization No Has patient had a PCN reaction occurring within the last 10 years: No If all of  the above answers are "NO", then may proceed with Cephalosporin use.      Patient Care Team: Melida Quitter, PA as PCP - General (Family Medicine) Sigmund Hazel, MD as Consulting Physician [facility])   Outpatient Medications Prior to Visit  Medication Sig   Multiple Vitamin (MULTIVITAMIN) tablet Take 1 tablet by mouth daily.   [DISCONTINUED] cetirizine (ZYRTEC) 10 MG tablet Take 10 mg by mouth daily.   [DISCONTINUED] Cholecalciferol (VITAMIN D3) 125 MCG (5000 UT) CAPS Take 1 capsule by mouth daily.   [DISCONTINUED] cyclobenzaprine (FLEXERIL) 10 MG tablet Take 1 tablet (10 mg total) by mouth 2 (two) times daily as needed for muscle spasms.   [DISCONTINUED] ibuprofen (ADVIL) 200 MG tablet Take 200 mg by mouth 2 (two) times daily as needed for moderate pain.   No facility-administered medications prior to visit.    Review of Systems  Constitutional:  Negative for chills, fever and malaise/fatigue.  HENT:  Negative for congestion and hearing loss.   Eyes:  Negative for blurred vision and double vision.  Respiratory:  Negative for cough and shortness of breath.   Cardiovascular:  Negative for chest pain, palpitations and leg swelling.  Gastrointestinal:  Negative for abdominal pain, constipation, diarrhea and heartburn.  Genitourinary:  Negative for frequency and urgency.  Musculoskeletal:  Negative for myalgias and neck pain.  Neurological:  Negative for headaches.  Endo/Heme/Allergies:  Negative for polydipsia.  Psychiatric/Behavioral:  Negative for depression. The patient has insomnia. The patient is not nervous/anxious.       Objective:    BP 122/69 (BP Location: Left Arm, Patient Position: Sitting, Cuff Size: Normal)   Pulse 61   Resp 18   Ht 4' 11.5" (1.511 m)   Wt 130 lb (59 kg)   LMP 06/23/2023 (Approximate)   SpO2 100%   BMI 25.82 kg/m    Physical Exam Constitutional:      General: She is not in acute distress.    Appearance: Normal appearance.  HENT:      Head: Normocephalic and atraumatic.     Right Ear: Tympanic membrane, ear canal and external ear normal. There is no impacted cerumen.     Left Ear: Tympanic membrane, ear canal and external ear normal. There is no impacted cerumen.     Ears:     Comments: Multiple piercings bilaterally, no signs of infection    Nose: Nose normal.     Mouth/Throat:  Mouth: Mucous membranes are moist.     Pharynx: No oropharyngeal exudate or posterior oropharyngeal erythema.  Eyes:     Extraocular Movements: Extraocular movements intact.     Conjunctiva/sclera: Conjunctivae normal.     Pupils: Pupils are equal, round, and reactive to light.  Neck:     Thyroid: No thyroid mass, thyromegaly or thyroid tenderness.  Cardiovascular:     Rate and Rhythm: Normal rate and regular rhythm.     Heart sounds: Normal heart sounds. No murmur heard.    No friction rub. No gallop.  Pulmonary:     Effort: Pulmonary effort is normal. No respiratory distress.     Breath sounds: Normal breath sounds. No wheezing, rhonchi or rales.  Abdominal:     General: Abdomen is flat. Bowel sounds are normal. There is no distension.     Palpations: There is no mass.     Tenderness: There is no abdominal tenderness. There is no guarding.  Musculoskeletal:        General: Normal range of motion.     Cervical back: Normal range of motion and neck supple.  Lymphadenopathy:     Cervical: No cervical adenopathy.  Skin:    General: Skin is warm and dry.  Neurological:     Mental Status: She is alert and oriented to person, place, and time.     Cranial Nerves: No cranial nerve deficit.     Motor: No weakness.     Deep Tendon Reflexes: Reflexes normal.  Psychiatric:        Mood and Affect: Mood normal.       Assessment & Plan:    Routine Health Maintenance and Physical Exam  Immunization History  Administered Date(s) Administered   Influenza Split 08/05/2015   Influenza, Seasonal, Injecte, Preservative Fre 08/08/2023    Influenza,inj,Quad PF,6+ Mos 07/31/2021, 07/24/2022   Influenza-Unspecified 07/21/2019, 07/24/2022   PFIZER(Purple Top)SARS-COV-2 Vaccination 12/21/2019, 01/14/2020   Tdap 01/15/2016, 02/25/2021    Health Maintenance  Topic Date Due   Hepatitis C Screening  Never done   COVID-19 Vaccine (3 - 2023-24 season) 08/24/2023 (Originally 06/12/2023)   Cervical Cancer Screening (HPV/Pap Cotest)  07/31/2026   DTaP/Tdap/Td (3 - Td or Tdap) 02/26/2031   INFLUENZA VACCINE  Completed   HIV Screening  Completed   HPV VACCINES  Aged Out    Fasting, collecting labs including CBC, CMP, lipid panel, A1C, TSH.  Agreeable to influenza vaccine today.  Discussed health benefits of physical activity, and encouraged her to engage in regular exercise appropriate for her age and condition.  Wellness examination  Screening for diabetes mellitus -     Hemoglobin A1c; Future  Encounter for screening for cardiovascular disorders -     Lipid panel; Future  Screening for viral disease -     Hepatitis C antibody; Future  Screening for thyroid disorder -     TSH Rfx on Abnormal to Free T4; Future  Anxiety -     CBC with Differential/Platelet; Future -     Comprehensive metabolic panel; Future  Need for influenza vaccination -     Flu vaccine trivalent PF, 6mos and older(Flulaval,Afluria,Fluarix,Fluzone)  Fatigue, unspecified type -     VITAMIN D 25 Hydroxy (Vit-D Deficiency, Fractures); Future -     Iron and TIBC; Future  Encouraged to continue monitoring her difficulties with sleep and keep a diary to identify any potential triggers or associations with menstrual cycle.  If necessary in the future, may consider ZIO monitor if heart  related, sleep medication (not trazodone or Ambien), or medication to reduce anxiety.  Patient verbalized understanding and is agreeable to this plan.  Return in about 1 year (around 08/07/2024) for annual physical, fasting blood work 1 week before.     Melida Quitter, PA

## 2023-08-08 NOTE — Patient Instructions (Addendum)
It was nice to meet you today, keep up the good work!  Here is the supplement that we talked about that I would love for help with sleep. Magnesium glycinate supplement: https://a.co/d/1zt1ID3  Let me know if issues sleeping or the racing heart become worse or more frequent.  We can also consider going ahead now with another monitor for you to wear for a couple of weeks.  Just let me know if that is something you would like to do now or hold off on!

## 2023-08-09 LAB — CBC WITH DIFFERENTIAL/PLATELET
Basophils Absolute: 0 10*3/uL (ref 0.0–0.2)
Basos: 1 %
EOS (ABSOLUTE): 0.2 10*3/uL (ref 0.0–0.4)
Eos: 2 %
Hematocrit: 40.7 % (ref 34.0–46.6)
Hemoglobin: 13.1 g/dL (ref 11.1–15.9)
Immature Grans (Abs): 0 10*3/uL (ref 0.0–0.1)
Immature Granulocytes: 0 %
Lymphocytes Absolute: 2.2 10*3/uL (ref 0.7–3.1)
Lymphs: 35 %
MCH: 30.9 pg (ref 26.6–33.0)
MCHC: 32.2 g/dL (ref 31.5–35.7)
MCV: 96 fL (ref 79–97)
Monocytes Absolute: 0.4 10*3/uL (ref 0.1–0.9)
Monocytes: 6 %
Neutrophils Absolute: 3.4 10*3/uL (ref 1.4–7.0)
Neutrophils: 56 %
Platelets: 321 10*3/uL (ref 150–450)
RBC: 4.24 x10E6/uL (ref 3.77–5.28)
RDW: 12 % (ref 11.7–15.4)
WBC: 6.1 10*3/uL (ref 3.4–10.8)

## 2023-08-09 LAB — LIPID PANEL
Chol/HDL Ratio: 1.9 ratio (ref 0.0–4.4)
Cholesterol, Total: 146 mg/dL (ref 100–199)
HDL: 75 mg/dL (ref >39)
LDL Chol Calc (NIH): 57 mg/dL (ref 0–99)
Triglycerides: 68 mg/dL (ref 0–149)
VLDL Cholesterol Cal: 14 mg/dL (ref 5–40)

## 2023-08-09 LAB — COMPREHENSIVE METABOLIC PANEL
ALT: 19 [IU]/L (ref 0–32)
AST: 20 [IU]/L (ref 0–40)
Albumin: 4.6 g/dL (ref 3.9–4.9)
Alkaline Phosphatase: 64 [IU]/L (ref 44–121)
BUN/Creatinine Ratio: 11 (ref 9–23)
BUN: 8 mg/dL (ref 6–20)
Bilirubin Total: 0.6 mg/dL (ref 0.0–1.2)
CO2: 23 mmol/L (ref 20–29)
Calcium: 9.3 mg/dL (ref 8.7–10.2)
Chloride: 103 mmol/L (ref 96–106)
Creatinine, Ser: 0.73 mg/dL (ref 0.57–1.00)
Globulin, Total: 2.5 g/dL (ref 1.5–4.5)
Glucose: 85 mg/dL (ref 70–99)
Potassium: 4.3 mmol/L (ref 3.5–5.2)
Sodium: 140 mmol/L (ref 134–144)
Total Protein: 7.1 g/dL (ref 6.0–8.5)
eGFR: 109 mL/min/{1.73_m2} (ref >59)

## 2023-08-09 LAB — VITAMIN D 25 HYDROXY (VIT D DEFICIENCY, FRACTURES): Vit D, 25-Hydroxy: 39.1 ng/mL (ref 30.0–100.0)

## 2023-08-09 LAB — HEMOGLOBIN A1C
Est. average glucose Bld gHb Est-mCnc: 94 mg/dL
Hgb A1c MFr Bld: 4.9 % (ref 4.8–5.6)

## 2023-08-09 LAB — IRON AND TIBC
Iron Saturation: 25 % (ref 15–55)
Iron: 95 ug/dL (ref 27–159)
Total Iron Binding Capacity: 381 ug/dL (ref 250–450)
UIBC: 286 ug/dL (ref 131–425)

## 2023-08-09 LAB — TSH RFX ON ABNORMAL TO FREE T4: TSH: 1.7 u[IU]/mL (ref 0.450–4.500)

## 2023-08-09 LAB — HEPATITIS C ANTIBODY: Hep C Virus Ab: NONREACTIVE

## 2023-11-17 ENCOUNTER — Encounter: Payer: Self-pay | Admitting: Family Medicine

## 2024-07-27 ENCOUNTER — Other Ambulatory Visit: Payer: Self-pay

## 2024-07-27 DIAGNOSIS — Z130 Encounter for screening for diseases of the blood and blood-forming organs and certain disorders involving the immune mechanism: Secondary | ICD-10-CM

## 2024-08-01 ENCOUNTER — Other Ambulatory Visit: Payer: Managed Care, Other (non HMO)

## 2024-08-01 DIAGNOSIS — Z130 Encounter for screening for diseases of the blood and blood-forming organs and certain disorders involving the immune mechanism: Secondary | ICD-10-CM

## 2024-08-02 LAB — COMPREHENSIVE METABOLIC PANEL WITH GFR
ALT: 14 IU/L (ref 0–32)
AST: 18 IU/L (ref 0–40)
Albumin: 4.5 g/dL (ref 3.9–4.9)
Alkaline Phosphatase: 60 IU/L (ref 41–116)
BUN/Creatinine Ratio: 12 (ref 9–23)
BUN: 9 mg/dL (ref 6–20)
Bilirubin Total: 0.8 mg/dL (ref 0.0–1.2)
CO2: 17 mmol/L — ABNORMAL LOW (ref 20–29)
Calcium: 9.2 mg/dL (ref 8.7–10.2)
Chloride: 106 mmol/L (ref 96–106)
Creatinine, Ser: 0.78 mg/dL (ref 0.57–1.00)
Globulin, Total: 2.6 g/dL (ref 1.5–4.5)
Glucose: 81 mg/dL (ref 70–99)
Potassium: 4.6 mmol/L (ref 3.5–5.2)
Sodium: 140 mmol/L (ref 134–144)
Total Protein: 7.1 g/dL (ref 6.0–8.5)
eGFR: 100 mL/min/1.73 (ref >59)

## 2024-08-02 LAB — CBC WITH DIFFERENTIAL/PLATELET
Basophils Absolute: 0 x10E3/uL (ref 0.0–0.2)
Basos: 0 %
EOS (ABSOLUTE): 0.2 x10E3/uL (ref 0.0–0.4)
Eos: 2 %
Hematocrit: 39 % (ref 34.0–46.6)
Hemoglobin: 13.1 g/dL (ref 11.1–15.9)
Immature Grans (Abs): 0 x10E3/uL (ref 0.0–0.1)
Immature Granulocytes: 0 %
Lymphocytes Absolute: 2 x10E3/uL (ref 0.7–3.1)
Lymphs: 30 %
MCH: 32 pg (ref 26.6–33.0)
MCHC: 33.6 g/dL (ref 31.5–35.7)
MCV: 95 fL (ref 79–97)
Monocytes Absolute: 0.5 x10E3/uL (ref 0.1–0.9)
Monocytes: 7 %
Neutrophils Absolute: 4 x10E3/uL (ref 1.4–7.0)
Neutrophils: 61 %
Platelets: 308 x10E3/uL (ref 150–450)
RBC: 4.1 x10E6/uL (ref 3.77–5.28)
RDW: 11.8 % (ref 11.7–15.4)
WBC: 6.7 x10E3/uL (ref 3.4–10.8)

## 2024-08-02 LAB — VITAMIN D 25 HYDROXY (VIT D DEFICIENCY, FRACTURES): Vit D, 25-Hydroxy: 30.3 ng/mL (ref 30.0–100.0)

## 2024-08-02 LAB — LIPID PANEL
Chol/HDL Ratio: 2.1 ratio (ref 0.0–4.4)
Cholesterol, Total: 149 mg/dL (ref 100–199)
HDL: 72 mg/dL (ref >39)
LDL Chol Calc (NIH): 65 mg/dL (ref 0–99)
Triglycerides: 58 mg/dL (ref 0–149)
VLDL Cholesterol Cal: 12 mg/dL (ref 5–40)

## 2024-08-02 LAB — HEMOGLOBIN A1C
Est. average glucose Bld gHb Est-mCnc: 91 mg/dL
Hgb A1c MFr Bld: 4.8 % (ref 4.8–5.6)

## 2024-08-02 LAB — TSH: TSH: 1.05 u[IU]/mL (ref 0.450–4.500)

## 2024-08-03 ENCOUNTER — Ambulatory Visit: Payer: Self-pay

## 2024-08-08 ENCOUNTER — Encounter: Payer: Managed Care, Other (non HMO) | Admitting: Family Medicine

## 2024-08-09 ENCOUNTER — Ambulatory Visit

## 2024-08-09 VITALS — BP 119/84 | HR 67 | Temp 98.1°F | Ht 59.5 in | Wt 130.1 lb

## 2024-08-09 DIAGNOSIS — Z0000 Encounter for general adult medical examination without abnormal findings: Secondary | ICD-10-CM | POA: Diagnosis not present

## 2024-08-09 DIAGNOSIS — Z23 Encounter for immunization: Secondary | ICD-10-CM | POA: Diagnosis not present

## 2024-08-09 DIAGNOSIS — G479 Sleep disorder, unspecified: Secondary | ICD-10-CM | POA: Diagnosis not present

## 2024-08-09 NOTE — Progress Notes (Signed)
 Complete physical exam  Patient: Stephanie Gomez   DOB: [birth year]   38 y.o. Female  MRN: [id number]  Subjective:    Chief Complaint  Patient presents with   Annual Exam    Transfer of Care    History of Present Illness   Stephanie Gomez is a 38 year old female who presents for an annual physical exam and review of lab results.  Sleep disturbance - Intermittent episodes of difficulty staying asleep - Previously tried Ambien  and melatonin; melatonin was ineffective - Trazodone was tried once and caused significant nausea  Laboratory findings - Recent labs showed normal blood counts, kidney and liver function, and balanced electrolytes - Cholesterol panel was excellent - Hemoglobin A1c was 4.8 - Thyroid function was normal - Vitamin D  level was on the lower end of normal  Medication and supplement use - Not currently taking any daily medications other than a daily multivitamin          Most recent fall risk assessment:    12/15/2022   11:19 AM  Fall Risk   Falls in the past year? 0  Number falls in past yr: 0  Injury with Fall? 0     Most recent depression screenings:    08/09/2024    4:00 PM 08/08/2023    8:22 AM  PHQ 2/9 Scores  PHQ - 2 Score 0 0  PHQ- 9 Score  3    Vision:Within last year and Dental: No current dental problems and Receives regular dental care    Patient Care Team: Gayle Saddie JULIANNA DEVONNA as PCP - General (Physician Assistant) Cleotilde Planas, MD as Consulting Physician (Family Medicine)   Outpatient Medications Prior to Visit  Medication Sig   Multiple Vitamin (MULTIVITAMIN) tablet Take 1 tablet by mouth daily.   No facility-administered medications prior to visit.    ROS  Per HPI      Objective:     BP 119/84   Pulse 67   Temp 98.1 F (36.7 C) (Oral)   Ht 4' 11.5 (1.511 m)   Wt 130 lb 1.9 oz (59 kg)   SpO2 99%   BMI 25.84 kg/m    Physical Exam Constitutional:      General: She is not in acute distress.     Appearance: Normal appearance.  Cardiovascular:     Rate and Rhythm: Normal rate and regular rhythm.     Heart sounds: Normal heart sounds. No murmur heard.    No friction rub. No gallop.  Pulmonary:     Effort: Pulmonary effort is normal. No respiratory distress.     Breath sounds: Normal breath sounds.  Abdominal:     General: Abdomen is flat. Bowel sounds are normal.  Musculoskeletal:        General: No swelling.     Cervical back: Neck supple.  Lymphadenopathy:     Cervical: No cervical adenopathy.  Skin:    General: Skin is warm and dry.  Neurological:     General: No focal deficit present.     Mental Status: She is alert.  Psychiatric:        Mood and Affect: Mood normal.        Behavior: Behavior normal.        Thought Content: Thought content normal.       No results found for any visits on 08/09/24. Last CBC Lab Results  Component Value Date   WBC 6.7 08/01/2024   HGB 13.1 08/01/2024   HCT  39.0 08/01/2024   MCV 95 08/01/2024   MCH 32.0 08/01/2024   RDW 11.8 08/01/2024   PLT 308 08/01/2024   Last metabolic panel Lab Results  Component Value Date   GLUCOSE 81 08/01/2024   NA 140 08/01/2024   K 4.6 08/01/2024   CL 106 08/01/2024   CO2 17 (L) 08/01/2024   BUN 9 08/01/2024   CREATININE 0.78 08/01/2024   EGFR 100 08/01/2024   CALCIUM 9.2 08/01/2024   PROT 7.1 08/01/2024   ALBUMIN 4.5 08/01/2024   LABGLOB 2.6 08/01/2024   AGRATIO 2.0 08/06/2022   BILITOT 0.8 08/01/2024   ALKPHOS 60 08/01/2024   AST 18 08/01/2024   ALT 14 08/01/2024   Last lipids Lab Results  Component Value Date   CHOL 149 08/01/2024   HDL 72 08/01/2024   LDLCALC 65 08/01/2024   LDLDIRECT 56 07/31/2021   TRIG 58 08/01/2024   CHOLHDL 2.1 08/01/2024   Last hemoglobin A1c Lab Results  Component Value Date   HGBA1C 4.8 08/01/2024   Last thyroid functions Lab Results  Component Value Date   TSH 1.050 08/01/2024   Last vitamin D  Lab Results  Component Value Date   VD25OH  30.3 08/01/2024        Assessment & Plan:    Routine Health Maintenance and Physical Exam  Health Maintenance  Topic Date Due   Hepatitis B Vaccine (1 of 3 - 19+ 3-dose series) Never done   HPV Vaccine (1 - 3-dose SCDM series) Never done   Flu Shot  05/11/2024   COVID-19 Vaccine (3 - 2025-26 season) 06/11/2024   Pap with HPV screening  07/31/2026   DTaP/Tdap/Td vaccine (3 - Td or Tdap) 02/26/2031   Hepatitis C Screening  Completed   HIV Screening  Completed   Pneumococcal Vaccine  Aged Out   Meningitis B Vaccine  Aged Out    Discussed health benefits of physical activity, and encouraged her to engage in regular exercise appropriate for her age and condition.  Encounter for vaccination -     Flu vaccine trivalent PF, 6mos and older(Flulaval,Afluria,Fluarix,Fluzone)  Wellness examination Assessment & Plan: Routine adult wellness visit with normal lab results. Excellent cholesterol panel. A1c is 4.8, indicating no risk of prediabetes or diabetes. Thyroid function is normal. Vitamin D  is low normal. Blood pressure is well-controlled. No current medications other than a multivitamin. - Administer flu vaccine. - Schedule next physical in one year. - Discuss genetic testing options with [facility] for breast and colon cancer. Results take 6-8 weeks and are part of a research study, thus free. - Continue annual monitoring of cholesterol panel. - Consider vitamin D  supplementation if experiencing low energy or fatigue.    Sleep disturbance Assessment & Plan: Intermittent insomnia with difficulty staying asleep. Previous trials of Ambien  and trazodone were not well-tolerated. - Consider over-the-counter melatonin and magnesium glycinate supplement to improve sleep quality and duration. - Discuss alternative sleep aids if insomnia persists.     Return in about 1 year (around 08/09/2025) for Physical.     Saddie JULIANNA Sacks, PA-C

## 2024-08-09 NOTE — Assessment & Plan Note (Signed)
 Routine adult wellness visit with normal lab results. Excellent cholesterol panel. A1c is 4.8, indicating no risk of prediabetes or diabetes. Thyroid function is normal. Vitamin D  is low normal. Blood pressure is well-controlled. No current medications other than a multivitamin. - Administer flu vaccine. - Schedule next physical in one year. - Discuss genetic testing options with [facility] for breast and colon cancer. Results take 6-8 weeks and are part of a research study, thus free. - Continue annual monitoring of cholesterol panel. - Consider vitamin D  supplementation if experiencing low energy or fatigue.

## 2024-08-09 NOTE — Assessment & Plan Note (Signed)
 Intermittent insomnia with difficulty staying asleep. Previous trials of Ambien  and trazodone were not well-tolerated. - Consider over-the-counter melatonin and magnesium glycinate supplement to improve sleep quality and duration. - Discuss alternative sleep aids if insomnia persists.

## 2024-08-09 NOTE — Patient Instructions (Signed)
 VISIT SUMMARY: Today, you had your annual physical exam and reviewed your lab results. We discussed your sleep issues and reviewed your recent lab work, which showed normal results overall, with a slightly low vitamin D  level.  YOUR PLAN: INSOMNIA: You have been experiencing difficulty staying asleep and episodes of waking up with a racing heart. Previous medications like Ambien  and trazodone were not well-tolerated. -Consider taking over-the-counter melatonin and magnesium glycinate supplements to help improve your sleep quality and duration. -If your sleep issues continue, we can discuss alternative sleep aids.  ADULT WELLNESS VISIT: This was your routine annual wellness visit. Your lab results were normal, with an excellent cholesterol panel and a normal A1c level. Your thyroid function is normal, and your vitamin D  level is slightly low. -You received a flu vaccine today. -Schedule your next physical exam in one year. -Consider genetic testing with [facility] for breast and colon cancer. The results take 6-8 weeks and are free as part of a research study. We discussed and addressed any legal concerns about insurance implications. -Continue to monitor your cholesterol levels annually. -Consider taking a vitamin D  supplement if you experience low energy or fatigue.  If you have any problems before your next visit feel free to message me via MyChart (minor issues or questions) or call the office, otherwise you may reach out to schedule an office visit.  Thank you! Saddie Sacks, PA-C

## 2024-10-18 ENCOUNTER — Ambulatory Visit
Admission: EM | Admit: 2024-10-18 | Discharge: 2024-10-18 | Disposition: A | Discharge status: Home / Self Care | Attending: Internal Medicine | Admitting: Internal Medicine

## 2024-10-18 ENCOUNTER — Ambulatory Visit: Payer: Self-pay | Admitting: *Deleted

## 2024-10-18 ENCOUNTER — Encounter: Payer: Self-pay | Admitting: Emergency Medicine

## 2024-10-18 DIAGNOSIS — I493 Ventricular premature depolarization: Secondary | ICD-10-CM

## 2024-10-18 DIAGNOSIS — H538 Other visual disturbances: Secondary | ICD-10-CM | POA: Diagnosis not present

## 2024-10-18 LAB — POCT URINE DIPSTICK
Bilirubin, UA: NEGATIVE
Blood, UA: NEGATIVE
Glucose, UA: NEGATIVE mg/dL
Ketones, POC UA: NEGATIVE mg/dL
Leukocytes, UA: NEGATIVE
Nitrite, UA: NEGATIVE
Protein Ur, POC: NEGATIVE mg/dL
Spec Grav, UA: 1.01
Urobilinogen, UA: 0.2 U/dL
pH, UA: 7

## 2024-10-18 NOTE — Telephone Encounter (Signed)
Looks like pt went to UC

## 2024-10-18 NOTE — Discharge Instructions (Addendum)
 EKG done today.  This shows a normal sinus rhythm with nonspecific changes. Urinalysis done today does not show any acute findings. Complete blood count and comprehensive metabolic panel done today.  This will check for anemia, liver problems, kidney problems, electrolyte disorder.  These results will take approximate 24 to 48 hours.  If there are any abnormalities that require further treatment we will contact you otherwise the results will be available in your MyChart. Possible causes of symptoms could include but not limited to a transient ischemic attack, retinal disturbance, vitreous detachment, other condition directly related to the eye.  Unfortunately evaluation of this will need more workup than what can be obtained here at urgent care however given the current complete resolution of symptoms and the evaluation that we have done thus far we feel it is reasonable to follow-up with your primary care doctor as soon as possible as well as your ophthalmologist as soon as possible with the understanding that if you have a recurrence of your symptoms you need to go to the emergency room immediately for further evaluation.

## 2024-10-18 NOTE — ED Provider Notes (Signed)
 " [facility]    CSN: [id number] Arrival date & time: 10/18/24  1551      History   Chief Complaint Chief Complaint  Patient presents with   Blurred Vision    HPI Stephanie Gomez is a 39 y.o. female.   39 year old female who presents urgent care secondary to visual disturbance.  At approximately 2:30 PM today she was driving and developed bilateral blurry vision.  This lasted about 30 to 40 minutes.  She thought that maybe she was just tearing up but then realized she had no tears.  It then progressed to just the right eye.  She did have an isolated blind spot in her right eye that has now resolved.  She had a little bit of tingling on the right cheek as well.  She reports that she has had similar symptoms several times over the last 2 to 3 years with similar course and presentation.  She has not sought any emergency care for this in the past.  She does have an ophthalmologist that she sees regularly as she does wear glasses.  She also sees her primary care provider regularly.  She did try to contact her primary care provider but the earliest appointment they had was on Tuesday.  She has a history of PVCs and has noted that her PVCs have been worse over the last several weeks.  She denies any history of cardiac surgery, irregular heart rates or peripheral arterial disease.  She did not have any dizziness, loss of consciousness, any recent illnesses.     Past Medical History:  Diagnosis Date   Anxiety    patient-activated cardiac event monitor 11/2017   Mostly NSR (some brady & tachy) - rates 45-142 bpm.  Rare PVCs (singles - no couplets, runs or bi-trigeminy), No PACs, PAT or arrhythmia such as SVT, A. fib or a flutter.   PONV (postoperative nausea and vomiting)    Sensation of chest tightness 11/29/2017   Wellness examination 08/09/2024    Patient Active Problem List   Diagnosis Date Noted   Wellness examination 08/09/2024   Sleep disturbance 08/09/2024   Pain of right  hip joint 04/28/2018   PVC's (premature ventricular contractions) 11/29/2017   Anxiety 12/15/2015    Past Surgical History:  Procedure Laterality Date   FRACTURE SURGERY     Nose fracture   NASAL FRACTURE SURGERY     TRANSTHORACIC ECHOCARDIOGRAM  11/2017   EF 50 and 55% with no regional Detore motion normality.  Normal valves.  No mitral prolapse.   WISDOM TOOTH EXTRACTION      OB History     Gravida  1   Para  1   Term  1   Preterm      AB      Living  1      SAB      IAB      Ectopic      Multiple  0   Live Births  1            Home Medications    Prior to Admission medications  Medication Sig Start Date End Date Taking? Authorizing Provider  Multiple Vitamin (MULTIVITAMIN) tablet Take 1 tablet by mouth daily.   Yes [provider]    Family History Family History  Adopted: Yes  Problem Relation Age of Onset   Seizures Mother        She is not aware of any details.  This is her birth  mother, and she has limited information   Hyperlipidemia Mother    Hypertension Mother    Skin cancer Maternal Grandfather    Cancer Maternal Grandfather    Skin cancer Paternal Grandfather    Cancer Paternal Grandmother     Social History Social History[1]   Allergies   Penicillins, Amoxil [amoxicillin], Apple, Cherry, Doxycycline , Peach flavor [flavoring agent], and Pear   Review of Systems Review of Systems  Constitutional:  Negative for chills and fever.  HENT:  Negative for ear pain and sore throat.   Eyes:  Positive for visual disturbance. Negative for pain.  Respiratory:  Negative for cough and shortness of breath.   Cardiovascular:  Negative for chest pain and palpitations.  Gastrointestinal:  Negative for abdominal pain and vomiting.  Genitourinary:  Negative for dysuria and hematuria.  Musculoskeletal:  Negative for arthralgias and back pain.  Skin:  Negative for color change and rash.  Neurological:  Negative for seizures and  syncope.  All other systems reviewed and are negative.    Physical Exam Triage Vital Signs ED Triage Vitals [10/18/24 1612]  Encounter Vitals Group     BP 124/86     Girls Systolic BP Percentile      Girls Diastolic BP Percentile      Boys Systolic BP Percentile      Boys Diastolic BP Percentile      Pulse Rate 78     Resp 14     Temp 98.2 F (36.8 C)     Temp Source Oral     SpO2 97 %     Weight      Height      Head Circumference      Peak Flow      Pain Score 2     Pain Loc      Pain Education      Exclude from Growth Chart    No data found.  Updated Vital Signs BP 124/86 (BP Location: Left Arm)   Pulse 78   Temp 98.2 F (36.8 C) (Oral)   Resp 14   LMP 10/02/2024 (Approximate)   SpO2 97%   Visual Acuity Right Eye Distance: 20/15 (corrected - glasses) Left Eye Distance: 20/15 (corrected - glasses) Bilateral Distance: 20/13 (corrected - glasses)  Right Eye Near:   Left Eye Near:    Bilateral Near:     Physical Exam Vitals and nursing note reviewed.  Constitutional:      General: She is not in acute distress.    Appearance: She is well-developed.  HENT:     Head: Normocephalic and atraumatic.     Right Ear: Tympanic membrane normal.     Left Ear: Tympanic membrane normal.     Nose: Nose normal.     Mouth/Throat:     Mouth: Mucous membranes are moist.  Eyes:     Conjunctiva/sclera: Conjunctivae normal.  Cardiovascular:     Rate and Rhythm: Normal rate and regular rhythm.     Heart sounds: No murmur heard. Pulmonary:     Effort: Pulmonary effort is normal. No respiratory distress.     Breath sounds: Normal breath sounds.  Abdominal:     Palpations: Abdomen is soft.     Tenderness: There is no abdominal tenderness.  Musculoskeletal:        General: No swelling.     Cervical back: Neck supple.  Skin:    General: Skin is warm and dry.     Capillary Refill: Capillary refill takes less  than 2 seconds.  Neurological:     General: No focal deficit  present.     Mental Status: She is alert and oriented to person, place, and time.     Cranial Nerves: No cranial nerve deficit.     Sensory: No sensory deficit.     Motor: No weakness.     Gait: Gait normal.  Psychiatric:        Mood and Affect: Mood normal.        Behavior: Behavior normal.        Thought Content: Thought content normal.        Judgment: Judgment normal.      UC Treatments / Results  Labs (all labs ordered are listed, but only abnormal results are displayed) Labs Reviewed  POCT URINE DIPSTICK - Abnormal; Notable for the following components:      Result Value   Clarity, UA cloudy (*)    All other components within normal limits  CBC  COMPREHENSIVE METABOLIC PANEL WITH GFR    EKG   Radiology No results found.  Procedures Procedures (including critical care time)  Medications Ordered in UC Medications - No data to display  Initial Impression / Assessment and Plan / UC Course  I have reviewed the triage vital signs and the nursing notes.  Pertinent labs & imaging results that were available during my care of the patient were reviewed by me and considered in my medical decision making (see chart for details).     Blurred vision - Plan: CBC, Comprehensive metabolic panel, POCT URINE DIPSTICK, CBC, Comprehensive metabolic panel, POCT URINE DIPSTICK  PVC's (premature ventricular contractions) - Plan: CBC, Comprehensive metabolic panel, POCT URINE DIPSTICK, CBC, Comprehensive metabolic panel, POCT URINE DIPSTICK   EKG done today.  This shows a normal sinus rhythm with nonspecific changes. Urinalysis done today does not show any acute findings. Complete blood count and comprehensive metabolic panel done today.  This will check for anemia, liver problems, kidney problems, electrolyte disorder.  These results will take approximate 24 to 48 hours.  If there are any abnormalities that require further treatment we will contact you otherwise the results will be  available in your MyChart. Possible causes of symptoms could include but not limited to a transient ischemic attack, retinal disturbance, vitreous detachment, other condition directly related to the eye.  Unfortunately evaluation of this will need more workup than what can be obtained here at urgent care however given the current complete resolution of symptoms and the evaluation that we have done thus far we feel it is reasonable to follow-up with your primary care doctor as soon as possible as well as your ophthalmologist as soon as possible with the understanding that if you have a recurrence of your symptoms you need to go to the emergency room immediately for further evaluation.  Final Clinical Impressions(s) / UC Diagnoses   Final diagnoses:  Blurred vision  PVC's (premature ventricular contractions)     Discharge Instructions      EKG done today.  This shows a normal sinus rhythm with nonspecific changes. Urinalysis done today does not show any acute findings. Complete blood count and comprehensive metabolic panel done today.  This will check for anemia, liver problems, kidney problems, electrolyte disorder.  These results will take approximate 24 to 48 hours.  If there are any abnormalities that require further treatment we will contact you otherwise the results will be available in your MyChart. Possible causes of symptoms could include but not limited  to a transient ischemic attack, retinal disturbance, vitreous detachment, other condition directly related to the eye.  Unfortunately evaluation of this will need more workup than what can be obtained here at urgent care however given the current complete resolution of symptoms and the evaluation that we have done thus far we feel it is reasonable to follow-up with your primary care doctor as soon as possible as well as your ophthalmologist as soon as possible with the understanding that if you have a recurrence of your symptoms you need to go to  the emergency room immediately for further evaluation.      ED Prescriptions   None    PDMP not reviewed this encounter.    [1]  Social History Tobacco Use   Smoking status: Never    Passive exposure: Never   Smokeless tobacco: Never  Vaping Use   Vaping status: Never Used  Substance Use Topics   Alcohol use: Yes    Alcohol/week: 7.0 standard drinks of alcohol    Types: 7 Cans of beer per week   Drug use: No     Teresa Almarie LABOR, PA-C 10/18/24 1731  "

## 2024-10-18 NOTE — ED Triage Notes (Addendum)
 Pt reports blurred vision that occurred while she was driving around 7:69ef today. Lasted 30-74mins. Pt states it initially felt like she was seeing through tears (watery blurry effect) and became worried when she realized she had no tears it was just blurred vision. Initially it was both eyes, but then it moved to solely the R eye. Notes after blurred vision had focal blindspot on R side. Pt notes tingling in R cheek for 1 min before vision issue resolved. This has happened several times over the last 3-4 yrs. Sees PCP for regular visits and eye doctor (no visit in 2025) - no abnormalities previously noted. Denies loss of balance/coordination, sudden weakness, slurred speech, or difficulty speaking. Equal strength in bilateral arms and legs. Facial symmetry noted. Vision has returned to normal. Denies photosensitivity.

## 2024-10-18 NOTE — Telephone Encounter (Signed)
 FYI Only or Action Required?: Action required by provider: request for appointment, clinical question for provider, and update on patient condition.  Patient was last seen in primary care on 08/09/2024 by Gayle Saddie FALCON, PA-C.  Called Nurse Triage reporting Blurred Vision. Wavy vision , now gone   Symptoms began today.  Interventions attempted: Nothing.  Symptoms are: stable.  Triage Disposition: See PCP When Office is Open (Within 3 Days)  Patient/caregiver understands and will follow disposition?: No, wishes to speak with PCP    Recommended UC. Patient reports sx gone now. Happened within the last hour and lasted x 20 minutes. Unsure patient will go to [facility] or ED. No available appt with PCP or other provider until Jan 13. Please advise.          Copied from CRM #[id number]. Topic: Clinical - Medical Advice >> Oct 18, 2024  2:48 PM Sophia H wrote: Reason for CRM: Patient had blurred/wavy vision for 20 minutes within the last hour, 4th time this has happened over 2-3 years. Req to speak with nurse Reason for Disposition  [1] Brief (now gone) blurred vision AND [2] unexplained  Answer Assessment - Initial Assessment Questions 1. DESCRIPTION: How has your vision changed? (e.g., complete vision loss, blurred vision, double vision, floaters, etc.)     Blurred vision and had wavy vision for 20 minutes 2. LOCATION: One or both eyes? If one, ask: Which eye?     Both eyes but worse right eye 3. SEVERITY: Can you see anything? If Yes, ask: What can you see? (e.g., fine print)     Can see now , no longer having blurred vision or wavy vision 4. ONSET: When did this begin? Did it start suddenly or has this been gradual?     Today while driving and lasted approx 20 minutes now gone, happened suddenly  5. PATTERN: Does this come and go, or has it been constant since it started?     Gone now. Has happened in the past but not for years 6. PAIN: Is there any pain in your eye(s)?   (Scale 1-10; or mild, moderate, severe)     Very mild pain if any and headache mild back of head when visual change noted 7. CONTACTS-GLASSES: Do you wear contacts or glasses?     na 8. CAUSE: What do you think is causing this visual problem?     Not sure  9. OTHER SYMPTOMS: Do you have any other symptoms? (e.g., confusion, headache, arm or leg weakness, speech problems)     Mild headache back of head when blurred vision wavy vision like mirage noted. No fever no pain no sx now.  10. PREGNANCY: Is there any chance you are pregnant? When was your last menstrual period?       na  Protocols used: Vision Loss or Change-A-AH

## 2024-10-19 ENCOUNTER — Ambulatory Visit: Payer: Self-pay

## 2024-10-19 LAB — COMPREHENSIVE METABOLIC PANEL WITH GFR
ALT: 14 IU/L (ref 0–32)
AST: 14 IU/L (ref 0–40)
Albumin: 4.3 g/dL (ref 3.9–4.9)
Alkaline Phosphatase: 55 IU/L (ref 41–116)
BUN/Creatinine Ratio: 16 (ref 9–23)
BUN: 9 mg/dL (ref 6–20)
Bilirubin Total: 0.3 mg/dL (ref 0.0–1.2)
CO2: 24 mmol/L (ref 20–29)
Calcium: 9 mg/dL (ref 8.7–10.2)
Chloride: 103 mmol/L (ref 96–106)
Creatinine, Ser: 0.57 mg/dL (ref 0.57–1.00)
Globulin, Total: 2.5 g/dL (ref 1.5–4.5)
Glucose: 88 mg/dL (ref 70–99)
Potassium: 4 mmol/L (ref 3.5–5.2)
Sodium: 141 mmol/L (ref 134–144)
Total Protein: 6.8 g/dL (ref 6.0–8.5)
eGFR: 120 mL/min/1.73

## 2024-10-19 LAB — CBC
Hematocrit: 36.2 % (ref 34.0–46.6)
Hemoglobin: 11.7 g/dL (ref 11.1–15.9)
MCH: 30.8 pg (ref 26.6–33.0)
MCHC: 32.3 g/dL (ref 31.5–35.7)
MCV: 95 fL (ref 79–97)
Platelets: 308 x10E3/uL (ref 150–450)
RBC: 3.8 x10E6/uL (ref 3.77–5.28)
RDW: 12.3 % (ref 11.7–15.4)
WBC: 6 x10E3/uL (ref 3.4–10.8)

## 2024-10-20 ENCOUNTER — Emergency Department (HOSPITAL_COMMUNITY)
Admission: EM | Admit: 2024-10-20 | Discharge: 2024-10-20 | Disposition: A | Discharge status: Home / Self Care | Attending: Emergency Medicine | Admitting: Emergency Medicine

## 2024-10-20 ENCOUNTER — Encounter (HOSPITAL_COMMUNITY): Payer: Self-pay | Admitting: *Deleted

## 2024-10-20 ENCOUNTER — Emergency Department (HOSPITAL_COMMUNITY)

## 2024-10-20 ENCOUNTER — Other Ambulatory Visit: Payer: Self-pay

## 2024-10-20 DIAGNOSIS — H539 Unspecified visual disturbance: Secondary | ICD-10-CM | POA: Diagnosis present

## 2024-10-20 LAB — COMPREHENSIVE METABOLIC PANEL WITH GFR
ALT: 14 U/L (ref 0–44)
AST: 21 U/L (ref 15–41)
Albumin: 4.3 g/dL (ref 3.5–5.0)
Alkaline Phosphatase: 68 U/L (ref 38–126)
Anion gap: 9 (ref 5–15)
BUN: 10 mg/dL (ref 6–20)
CO2: 26 mmol/L (ref 22–32)
Calcium: 9.2 mg/dL (ref 8.9–10.3)
Chloride: 104 mmol/L (ref 98–111)
Creatinine, Ser: 0.69 mg/dL (ref 0.44–1.00)
GFR, Estimated: >60 mL/min
Glucose, Bld: 126 mg/dL — ABNORMAL HIGH (ref 70–99)
Potassium: 3.3 mmol/L — ABNORMAL LOW (ref 3.5–5.1)
Sodium: 139 mmol/L (ref 135–145)
Total Bilirubin: 0.4 mg/dL (ref 0.0–1.2)
Total Protein: 7.4 g/dL (ref 6.5–8.1)

## 2024-10-20 LAB — CBC WITH DIFFERENTIAL/PLATELET
Abs Immature Granulocytes: 0.02 K/uL (ref 0.00–0.07)
Basophils Absolute: 0 K/uL (ref 0.0–0.1)
Basophils Relative: 0 %
Eosinophils Absolute: 0.2 K/uL (ref 0.0–0.5)
Eosinophils Relative: 2 %
HCT: 36.7 % (ref 36.0–46.0)
Hemoglobin: 12.5 g/dL (ref 12.0–15.0)
Immature Granulocytes: 0 %
Lymphocytes Relative: 41 %
Lymphs Abs: 2.9 K/uL (ref 0.7–4.0)
MCH: 31.4 pg (ref 26.0–34.0)
MCHC: 34.1 g/dL (ref 30.0–36.0)
MCV: 92.2 fL (ref 80.0–100.0)
Monocytes Absolute: 0.5 K/uL (ref 0.1–1.0)
Monocytes Relative: 7 %
Neutro Abs: 3.4 K/uL (ref 1.7–7.7)
Neutrophils Relative %: 50 %
Platelets: 324 K/uL (ref 150–400)
RBC: 3.98 MIL/uL (ref 3.87–5.11)
RDW: 12.5 % (ref 11.5–15.5)
WBC: 6.9 K/uL (ref 4.0–10.5)
nRBC: 0 % (ref 0.0–0.2)

## 2024-10-20 LAB — PROTIME-INR
INR: 1 (ref 0.8–1.2)
Prothrombin Time: 13.9 s (ref 11.4–15.2)

## 2024-10-20 LAB — MAGNESIUM: Magnesium: 2.3 mg/dL (ref 1.7–2.4)

## 2024-10-20 LAB — POC URINE PREG, ED
Preg Test, Ur: NEGATIVE
Preg Test, Ur: NEGATIVE

## 2024-10-20 MED ORDER — KETOROLAC TROMETHAMINE 15 MG/ML IJ SOLN
15.0000 mg | Freq: Once | INTRAMUSCULAR | Status: AC
  Administered 2024-10-20: 15 mg via INTRAVENOUS
  Filled 2024-10-20: qty 1

## 2024-10-20 MED ORDER — PROCHLORPERAZINE EDISYLATE 10 MG/2ML IJ SOLN
10.0000 mg | Freq: Once | INTRAMUSCULAR | Status: AC
  Administered 2024-10-20: 10 mg via INTRAVENOUS
  Filled 2024-10-20: qty 2

## 2024-10-20 MED ORDER — LACTATED RINGERS IV BOLUS
1000.0000 mL | Freq: Once | INTRAVENOUS | Status: AC
  Administered 2024-10-20: 1000 mL via INTRAVENOUS

## 2024-10-20 MED ORDER — IOHEXOL 350 MG/ML SOLN
75.0000 mL | Freq: Once | INTRAVENOUS | Status: AC | PRN
  Administered 2024-10-20: 75 mL via INTRAVENOUS

## 2024-10-20 MED ORDER — POTASSIUM CHLORIDE 20 MEQ PO PACK
40.0000 meq | PACK | Freq: Once | ORAL | Status: AC
  Administered 2024-10-20: 40 meq via ORAL
  Filled 2024-10-20: qty 2

## 2024-10-20 NOTE — ED Notes (Signed)
 Pt returned from CT

## 2024-10-20 NOTE — ED Notes (Signed)
 Patient transported to CT

## 2024-10-20 NOTE — ED Triage Notes (Signed)
 Pt is coming in for left eye blurriness and seeing little things in her left eye. She has some head pressure at the back of her head as well. This happened earlier this week and she went to UC but by the time she got there the symptoms had resolved. She has no unilateral numbness and no deficits at this time

## 2024-10-20 NOTE — ED Provider Notes (Cosign Needed)
 " [place] [facility] Provider Note   HPI/ROS    History obtained from patient.  Stephanie Gomez is a 39 y.o. female who presents for Eye Blurriness and who  has a past medical history of Anxiety, patient-activated cardiac event monitor (11/2017), PONV (postoperative nausea and vomiting), Sensation of chest tightness (11/29/2017), and Wellness examination (08/09/2024).  Patient presents today for visual changes.  On Thursday she had an episode of bilateral blurry vision with intermittent lines going across her vision that then went to just her right eye.  She states she had a occipital headache in that time and all of these resolved without any intervention.  Today around 2:30 PM she started having bilateral blurry vision again with similar symptoms on the left side where she felt that her left lateral vision was obscured and started seeing the lines, crossed her vision again.  Described a field cut, but states that is blurred on the peripheral of her vision.  She also had a headache at that time.  States 2 days ago she woke up with some neck pain but did not think much of it.  Otherwise had COVID like illness last week but is getting over that currently.  Currently denies fevers, chills, chest pain, shortness of breath, nausea, vomiting, diarrhea.  Denies any numbness or tingling in her extremities.  States that headache has largely resolved now and her vision is back to baseline.  Denies any history of blood clots.  States has been feeling more PVCs recently.   MDM   I have reviewed the nursing documentation, vital signs, as well as the past medical history, surgical history, family history, and social history.  Initial Assessment:  Patient hemodynamically stable on initial evaluation.  Currently asymptomatic at this time with no visual difficulties or headache or neck pain.  Said these come in waves more than anything, but has had the neck pain a little bit more  consistently.  Presentation not consistent with glaucoma, as it is transient and resolving.  Not truly consistent with CRAO or CRVO either.  Could possibly be stroke, and given the odd nature of it we will obtain CTA head and neck.  Retinal detachment improvement and then worsening and then improving again would be uncommon.  Less likely be vitreous hemorrhage or detachment but patient does seem to have floaters, so could be this.  Will obtain basic labs as well as CTA head and neck and touch base with ophthalmology.  No other gross neurologic deficit at this time.  No temporal tenderness considered stable GCA and no prior history of autoimmune disease.  No signs of orbital cellulitis.  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  Will give migraine cocktail here although patient is currently asymptomatic.  Labs here are unremarkable with no significant leukocytosis, anemia, or thrombocytopenia.  Urine pregnancy negative and PT within normal limits.  Did have mild hypokalemia, so patient was given p.o. potassium for repletion.  Feeling better after Toradol , Compazine , and fluids.  CTA here unremarkable with no obvious intracranial hemorrhage or stroke.  Spoke with patient and discussed that we do not know exactly what is causing her vision symptoms right now.  We did discuss that she has no obvious emergent medical condition at this time though and we feel she stable for discharge.  She is okay with this at this time and plans to return to the Emergency Department if her symptoms return or worsen.  Also plans  to follow-up with ophthalmology in the outpatient setting to have a dilated funduscopic exam.  Also sent neurology referral given concern for possible complex migraines.  Ophthalmology plan to see the patient next week in clinic.  Patient discharged home in hemodynamically stable condition with strict precautions.  She plans to return to the emergency department  for any further concerns.  Disposition:  I discussed the plan for discharge with the patient and/or their surrogate at bedside prior to discharge and they were in agreement with the plan and verbalized understanding of the return precautions provided. All questions answered to the best of my ability. Ultimately, the patient was discharged in stable condition with stable vital signs. I am reassured that they are capable of close follow up and good social support at home.    This patient was staffed with Dr. Dasie who supervised the visit and agreed with the plan of care.   Due to the patients current presenting symptoms, physical exam findings, and the workup stated above, it is thought that the etiology of the patients current presentation is:  1. Vision changes     Clinical Complexity A medically appropriate history, review of systems, and physical exam was performed.  Factors that affect the complexity of this encounter: assessment of correct protocol, laboratory work from this visit, and review of echocardiogram/EKG results  My independent interpretations of diagnostic studies are documented in the ED course above.   If decision rules were used in this patient's evaluation, they are listed below.   Click here for ABCD2, HEART and other calculators  Patient's presentation is most consistent with acute complicated illness / injury requiring diagnostic workup.  MDM generated using voice dictation software and may contain dictation errors. Please contact me for any clarification or with any questions.    Physical Exam, PMH, PSH, Family History, and Social Hsitory   Vitals:   10/20/24 1905 10/20/24 1910 10/20/24 2000 10/20/24 2100  BP: (!) 150/89  (!) 119/92 110/81  Pulse: 85  80 72  Resp: 18   14  Temp: 98 F (36.7 C)     TempSrc: Oral     SpO2: 99%  100% 100%  Weight:  59 kg    Height:  4' 10.25 (1.48 m)      Physical Exam Constitutional:      Appearance: Normal  appearance.  HENT:     Head: Normocephalic and atraumatic.  Eyes:     Extraocular Movements: Extraocular movements intact.     Right eye: Normal extraocular motion and no nystagmus.     Left eye: Normal extraocular motion and no nystagmus.     Conjunctiva/sclera: Conjunctivae normal.     Right eye: Right conjunctiva is not injected.     Left eye: Left conjunctiva is not injected.     Pupils: Pupils are equal, round, and reactive to light.     Comments: No visual field cut.  Cardiovascular:     Rate and Rhythm: Normal rate and regular rhythm.  Pulmonary:     Effort: Pulmonary effort is normal.     Breath sounds: Normal breath sounds. No wheezing, rhonchi or rales.  Abdominal:     General: Abdomen is flat.     Palpations: Abdomen is soft.     Tenderness: There is no abdominal tenderness. There is no guarding or rebound.  Musculoskeletal:     Cervical back: Normal range of motion. No rigidity.  Skin:    General: Skin is warm and dry.  Capillary Refill: Capillary refill takes less than 2 seconds.  Neurological:     General: No focal deficit present.     Mental Status: She is alert and oriented to person, place, and time.     GCS: GCS eye subscore is 4. GCS verbal subscore is 5. GCS motor subscore is 6.     Cranial Nerves: Cranial nerves 2-12 are intact.     Comments: Intact sensation in all extremities.  Full and symmetric strength in all extremities.     Past Medical History:  Diagnosis Date   Anxiety    patient-activated cardiac event monitor 11/2017   Mostly NSR (some brady & tachy) - rates 45-142 bpm.  Rare PVCs (singles - no couplets, runs or bi-trigeminy), No PACs, PAT or arrhythmia such as SVT, A. fib or a flutter.   PONV (postoperative nausea and vomiting)    Sensation of chest tightness 11/29/2017   Wellness examination 08/09/2024     Past Surgical History:  Procedure Laterality Date   FRACTURE SURGERY     Nose fracture   NASAL FRACTURE SURGERY      TRANSTHORACIC ECHOCARDIOGRAM  11/2017   EF 50 and 55% with no regional Tersigni motion normality.  Normal valves.  No mitral prolapse.   WISDOM TOOTH EXTRACTION       Family History  Adopted: Yes  Problem Relation Age of Onset   Seizures Mother        She is not aware of any details.  This is her birth mother, and she has limited information   Hyperlipidemia Mother    Hypertension Mother    Skin cancer Maternal Grandfather    Cancer Maternal Grandfather    Skin cancer Paternal Grandfather    Cancer Paternal Grandmother     Social History   Tobacco Use   Smoking status: Never    Passive exposure: Never   Smokeless tobacco: Never  Substance Use Topics   Alcohol use: Yes    Alcohol/week: 7.0 standard drinks of alcohol    Types: 7 Cans of beer per week     Procedures   If procedures were preformed on this patient, they are listed below:  Procedures   Electronically signed by:   Glendia Carlin Ancona, M.D. PGY-2, Emergency Medicine   Please note that this documentation was produced with the assistance of voice-to-text technology and may contain errors.    Ancona Glendia, MD 10/20/24 2315  "

## 2024-10-20 NOTE — Discharge Instructions (Signed)
 You were seen today for vision changes. While you were here we monitored your vitals, preformed a physical exam, and labs, imaging, and consultation from a specialist. These were all reassuring and there is no indication for any further testing or intervention in the emergency department at this time.   Things to do:  - Follow up with your primary care provider within the next 1-2 weeks - Please call to schedule an appointment with ophthalmology @ [phone]  - We have sent referral to neurology for you, please call them at [phone] if you do not hear from them in 3 to 5 days for an appointment  Return to the emergency department if you have any new or worsening symptoms including headaches, worsening or recurrent visual changes, numbness or tingling in extremities, difficulty ambulating, or if you have any other concerns.

## 2024-10-20 NOTE — ED Provider Notes (Signed)
 I saw and evaluated the patient, reviewed the resident's note and I agree with the findings and plan.      39 year old female presents with of left eye blurry vision.  Patient states that she has to this for about 2530 minutes today.  Occurred suddenly.  No associated eye pain.  No headache.  Had a similar episode several days ago.  States she has had some associated neck pain.  Denies any focal weakness.  Will perform CT angio head and neck for dissection as well as likely consult ophthalmology   Dasie Faden, MD 10/20/24 2016

## 2024-10-22 ENCOUNTER — Encounter: Payer: Self-pay | Admitting: Family Medicine

## 2024-10-22 ENCOUNTER — Other Ambulatory Visit: Payer: Self-pay

## 2024-10-22 ENCOUNTER — Ambulatory Visit (INDEPENDENT_AMBULATORY_CARE_PROVIDER_SITE_OTHER): Admitting: Family Medicine

## 2024-10-22 ENCOUNTER — Emergency Department (HOSPITAL_BASED_OUTPATIENT_CLINIC_OR_DEPARTMENT_OTHER): Admission: EM | Admit: 2024-10-22 | Discharge: 2024-10-22 | Disposition: A | Discharge status: Home / Self Care

## 2024-10-22 VITALS — BP 116/79 | HR 81 | Ht 58.25 in | Wt 130.1 lb

## 2024-10-22 DIAGNOSIS — H539 Unspecified visual disturbance: Secondary | ICD-10-CM | POA: Diagnosis not present

## 2024-10-22 DIAGNOSIS — G43109 Migraine with aura, not intractable, without status migrainosus: Secondary | ICD-10-CM | POA: Diagnosis present

## 2024-10-22 DIAGNOSIS — M542 Cervicalgia: Secondary | ICD-10-CM | POA: Diagnosis not present

## 2024-10-22 MED ORDER — TOPIRAMATE 25 MG PO TABS: 25.0000 mg | ORAL_TABLET | Freq: Every day | ORAL | 1 refills | Status: AC

## 2024-10-22 MED ORDER — NURTEC 75 MG PO TBDP: 1.0000 | ORAL_TABLET | Freq: Every day | ORAL | 0 refills | Status: AC | PRN

## 2024-10-22 NOTE — Patient Instructions (Addendum)
" °  VISIT SUMMARY: Today we discussed your recurrent visual disturbances and new neck pain. We have a plan to address both issues and ensure you receive the appropriate follow-up care.  YOUR PLAN: You have been experiencing visual disturbances without headaches, which may be ocular migraines. -We have ordered an MRI to further investigate your symptoms. -You have been referred to ophthalmology for further evaluation. -Nurtec has been prescribed for relief and prevention of your symptoms. We discussed the potential side effects and cost. -Seek emergency care if you develop any neurological symptoms.  CERVICALGIA: You have intermittent left-sided neck pain, likely due to sleeping awkwardly. -Continue using over-the-counter pain relief as needed.   "

## 2024-10-22 NOTE — ED Provider Notes (Signed)
 " [place] EMERGENCY DEPARTMENT AT [facility] Provider Note   CSN: [id number] Arrival date & time: 10/22/24  0844     Patient presents with: Eye Problem   Stephanie Gomez is a 39 y.o. female with PMHx anxiety, PVC who presents to ED concerned for intermittent vision blurring. Patient stating over the past 2-3 years, these same symptoms might have happened around 2 times, but they became much more common since last Thursday. Patient endorsing around 30 minute episodes of vision blurring with random white lines or spots in vision. Blurred vision usually starts in both eyes but then transfer to just right or left eye randomly. Sometimes patient has occipital headaches with these episodes, but did not have any headaches today. Today patient's blurred vision started in both eyes and then localized to the right eye with a line of visual deficit in a horizontal line in the middle of her vision.   Headaches are not positional and are not present when she wakes up in the morning. Headaches resolve without medical management.   Patient did have viral illness last week which she has been recovering well from.   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   Patient was seen in ED a couple days ago for same symptoms and was discharged and told to follow up with neurology and with a neurologist.  Patient was going to call them today since is now Monday and their office should be open.  Patient is currently asymptomatic.      Eye Problem      Prior to Admission medications  Medication Sig Start Date End Date Taking? Authorizing Provider  topiramate  (TOPAMAX ) 25 MG tablet Take 1 tablet (25 mg total) by mouth daily. Can increase to 50mg  daily after 2 weeks if symptoms persist and you are tolerating medication well. 10/22/24 12/21/24 Yes Charman Blasco,  Nidia F, PA-C  Multiple Vitamin (MULTIVITAMIN) tablet Take 1 tablet by mouth daily.    [provider]    Allergies: Penicillins, Amoxil [amoxicillin], Apple, Cherry, Doxycycline , Peach flavor [flavoring agent], and Pear    Review of Systems  Eyes:  Positive for visual disturbance.    Updated Vital Signs BP 121/84   Pulse 79   Temp 98.5 F (36.9 C) (Oral)   Resp 16   LMP 10/02/2024 (Approximate)   SpO2 100%   Physical Exam Vitals and nursing note reviewed.  Constitutional:      General: She is not in acute distress.    Appearance: She is not ill-appearing or toxic-appearing.  HENT:     Head: Normocephalic and atraumatic.     Mouth/Throat:     Mouth: Mucous membranes are moist.     Pharynx: No oropharyngeal exudate or posterior oropharyngeal erythema.  Eyes:     General: No scleral icterus.       Right eye: No discharge.        Left eye: No discharge.     Extraocular Movements: Extraocular movements intact.     Conjunctiva/sclera: Conjunctivae normal.     Pupils: Pupils are equal, round, and reactive to light.     Comments: No current visual field deficits  Cardiovascular:     Rate and Rhythm: Normal rate.     Pulses: Normal pulses.     Heart sounds: No murmur heard. Pulmonary:     Effort: Pulmonary effort is  normal. No respiratory distress.     Breath sounds: No wheezing, rhonchi or rales.  Abdominal:     Tenderness: There is no abdominal tenderness.  Musculoskeletal:     Right lower leg: No edema.     Left lower leg: No edema.  Skin:    General: Skin is warm and dry.     Findings: No rash.  Neurological:     General: No focal deficit present.     Mental Status: She is alert and oriented to person, place, and time. Mental status is at baseline.     Comments: GCS 15. Speech is goal oriented. No deficits appreciated to CN III-XII. Patient moves extremities without ataxia. Patient ambulatory with steady gait.   Psychiatric:        Mood and Affect:  Mood normal.        Behavior: Behavior normal.     (all labs ordered are listed, but only abnormal results are displayed) Labs Reviewed - No data to display  EKG: None  Radiology: CT ANGIO HEAD NECK W WO CM Result Date: 10/20/2024 EXAM: CTA Head and Neck with Intravenous Contrast. CT Head without Contrast. CLINICAL HISTORY: Neuro deficit, acute, stroke suspected TECHNIQUE: Axial CTA images of the head and neck performed with intravenous contrast. MIP reconstructed images were created and reviewed. Axial computed tomography images of the head/brain performed without intravenous contrast. Note: Per PQRS, the description of internal carotid artery percent stenosis, including 0 percent or normal exam, is based on North American Symptomatic Carotid Endarterectomy Trial (NASCET) criteria. Dose reduction technique was used including one or more of the following: automated exposure control, adjustment of mA and kV according to patient size, and/or iterative reconstruction. CONTRAST: With; COMPARISON: None provided. FINDINGS: CT HEAD: BRAIN: No acute intraparenchymal hemorrhage. No mass lesion. No CT evidence for acute territorial infarct. No midline shift or extra-axial collection. VENTRICLES: No hydrocephalus. ORBITS: The orbits are unremarkable. SINUSES AND MASTOIDS: The paranasal sinuses and mastoid air cells are clear. CTA NECK: COMMON CAROTID ARTERIES: No significant stenosis. No dissection or occlusion. INTERNAL CAROTID ARTERIES: No stenosis by NASCET criteria. No dissection or occlusion. VERTEBRAL ARTERIES: No significant stenosis. No dissection or occlusion. CTA HEAD: ANTERIOR CEREBRAL ARTERIES: No significant stenosis. No occlusion. No aneurysm. MIDDLE CEREBRAL ARTERIES: No significant stenosis. No occlusion. No aneurysm. POSTERIOR CEREBRAL ARTERIES: No significant stenosis. No occlusion. No aneurysm. BASILAR ARTERY: No significant stenosis. No occlusion. No aneurysm. OTHER: SOFT TISSUES: No acute  finding. No masses or lymphadenopathy. BONES: No acute osseous abnormality. IMPRESSION: 1. No acute intracranial hemorrhage or ischemic change. 2. No evidence of significant stenosis, aneurysmal dilatation, or dissection involving the arteries of the head and neck. Electronically signed by: Franky Stanford MD MD 10/20/2024 09:39 PM EST RP Workstation: [id number]     Procedures   Medications Ordered in the ED - No data to display                                  Medical Decision Making  This patient presents to the ED for concern of vision blurring, this involves an extensive number of treatment options, and is a complaint that carries with it a high risk of complications and morbidity.  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  History concerning for ocular migraines.  Presented to ED a couple days ago for similar symptoms.  Physical/neuroexam currently unremarkable as patient is asymptomatic.  Rest of physical exam reassuring. I requested consultation with the neurologist on-call Dr. Matthews,  and discussed lab and imaging findings as well as pertinent plan - they recommend: Starting patient on topiramate  if they do not have history of kidney stones.  Symptoms probably related to ocular migraines.  Patient will need outpatient MRI and neurology follow-up. Educated patient on Dr. Allie recommendations.   Patient denies hx of kidney stones. I answered all questions.  Patient agreeable to plan.  Unfortunately, my attending and I are not able to order outpatient MRI's and this freestanding ED does not have MRI available today. Patient educated on following up with PCP and neurologist for this order.  I have reviewed the patients home medicines and have made adjustments as needed The patient has been appropriately medically screened and/or stabilized in the ED. I have low suspicion for any other emergent medical condition which would require further screening, evaluation or treatment in the ED or require inpatient management. At time of discharge the patient is hemodynamically stable and in no acute distress. I have discussed work-up results and diagnosis with patient and answered all questions. Patient is agreeable with discharge plan. We discussed strict return precautions for returning to the emergency department and they verbalized understanding.     Social Determinants of Health:  none       Final diagnoses:  Ocular migraine    ED Discharge Orders          Ordered    topiramate  (TOPAMAX ) 25 MG tablet  Daily        10/22/24 1046               Hoy Nidia FALCON, [place] 10/22/24 1051  "

## 2024-10-22 NOTE — Progress Notes (Signed)
" ° °  Established Patient Office Visit  Subjective   Patient ID: Stephanie Gomez, female    DOB: Feb 10, 1986  Age: 39 y.o. MRN: [id number]  Chief Complaint  Patient presents with   Blurred Vision     History of Present Illness   Stephanie Gomez is a 39 year old female who presents with recurrent visual disturbances.  She has had recurrent visual disturbances for several years, with intermittent episodes over the past few days. Each episode lasts about 25 to 30 minutes and consists of visual distortions without headache.  During episodes she has bilateral then right-sided blurriness, white spots, and elongated or C-shaped squiggly lines, sometimes described as looking through water.  She has no migraines and has not taken medication for these symptoms, though she was recently prescribed Topamax  and has not started it. She was seen at urgent care on January 8 and in the ED on January 10. Head CT was normal. MRI has not been done yet. Optometrist exam with pressure testing and retinal scans was normal. She has neurology follow-up planned.  She developed new left-sided neck pain starting Thursday, thought to be from sleeping awkwardly. Tylenol  and Toradol  in the ED gave temporary relief of neck pain without clear effect on her visual episodes.  She denies double vision, slurred speech, hearing changes, limb weakness, or language difficulty.          The ASCVD Risk score (Arnett DK, et al., 2019) failed to calculate for the following reasons:   The 2019 ASCVD risk score is only valid for ages 57 to 25  Health Maintenance Due  Topic Date Due   COVID-19 Vaccine (3 - 2025-26 season) 06/11/2024      Objective:     BP 116/79   Pulse 81   Ht 4' 10.25 (1.48 m)   Wt 130 lb 1.9 oz (59 kg)   LMP 10/02/2024 (Approximate)   SpO2 99%   BMI 26.96 kg/m    Physical Exam   Gen: alert, oriented HEENT: perrla, eomi.  Peripheral vision intact.  Fundoscopic exam attempted, no abnormal findings  noted.  Pulm: no respiratory distress Psych: pleasant affect NEUROLOGICAL: Cranial nerves grossly intact. Sensation symmetric and intact.        No results found for any visits on 10/22/24.      Assessment & Plan:   Visual disturbance Assessment & Plan: Intermittent visual disturbances without headache. CT scan normal. Optometrist suggested ocular migraines. Neurology appointment scheduled. MRI recommended. Nurtec prescribed for relief and prevention. Discussed side effects and cost. - Ordered MRI without contrast. - Referred to ophthalmology. - Prescribed Nurtec for symptomatic relief and prevention. - Advised to hold off on Topamax  until neurology consultation. - Instructed to seek emergency care if neurological symptoms develop.   Cervicalgia Assessment & Plan: Intermittent left-sided neck pain, possibly positional. No neurological symptoms. Pain relieved by Toradol . - Continue over-the-counter pain relief as needed.    Other orders -     Nurtec; Take 1 tablet (75 mg total) by mouth daily as needed.  Dispense: 8 tablet; Refill: 0        Return in about 4 months (around 02/19/2025), or if symptoms worsen or fail to improve, for w/ kara for vision sx.    Toribio MARLA Slain, MD  "

## 2024-10-22 NOTE — ED Triage Notes (Addendum)
 Patient states blurred vision states has happened four times since Thursday. Reports blurred vision has mostly resolved at this time. Was seen at [facility] on 1/10 and received testing and CT scan but was told to come back if it reoccurred.

## 2024-10-22 NOTE — Discharge Instructions (Addendum)
 Take prescribed medications at night. I was not able to schedule an outpatient MRI for you today. You will need to follow up with your primary care provider. Please also call Neurology and ophthalmology today and tell them that you were seen in the emergency room and need a follow-up appointment.  Seek emergency care if experiencing any new or worsening symptoms.  Alternating between 650 mg Tylenol  and 400 mg Advil : The best way to alternate taking Acetaminophen  (example Tylenol ) and Ibuprofen  (example Advil /Motrin ) is to take them 3 hours apart. For example, if you take ibuprofen  at 6 am you can then take Tylenol  at 9 am. You can continue this regimen throughout the day, making sure you do not exceed the recommended maximum dose for each drug.

## 2024-10-23 ENCOUNTER — Other Ambulatory Visit (HOSPITAL_COMMUNITY): Payer: Self-pay

## 2024-10-23 ENCOUNTER — Telehealth: Payer: Self-pay

## 2024-10-23 ENCOUNTER — Encounter: Payer: Self-pay | Admitting: Neurology

## 2024-10-23 ENCOUNTER — Ambulatory Visit: Admitting: Neurology

## 2024-10-23 VITALS — BP 111/75 | HR 78 | Ht 59.0 in | Wt 132.5 lb

## 2024-10-23 DIAGNOSIS — H539 Unspecified visual disturbance: Secondary | ICD-10-CM | POA: Insufficient documentation

## 2024-10-23 DIAGNOSIS — Z9189 Other specified personal risk factors, not elsewhere classified: Secondary | ICD-10-CM

## 2024-10-23 DIAGNOSIS — G43109 Migraine with aura, not intractable, without status migrainosus: Secondary | ICD-10-CM

## 2024-10-23 DIAGNOSIS — M542 Cervicalgia: Secondary | ICD-10-CM | POA: Insufficient documentation

## 2024-10-23 NOTE — Patient Instructions (Addendum)
 It was nice to meet you today.   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   Here is what we discussed today and my recommendations for you:   Please remember, common headache and migraine or visual aura triggers are: sleep deprivation, dehydration, overheating, stress, hypoglycemia or skipping meals and blood sugar fluctuations, excessive pain medications or excessive alcohol use or caffeine withdrawal. Some people have food triggers such as aged cheese, orange juice or chocolate, especially dark chocolate, or MSG (monosodium glutamate). Try to avoid these headache triggers as much possible. It may be helpful to keep a headache diary to figure out what makes your headaches worse or brings them on and what alleviates them. Some people report headache onset after exercise but studies have shown that regular exercise may actually prevent headaches from coming. If you have exercise-induced headaches, please make sure that you drink plenty of fluid before and after exercising and that you do not over do it and do not overheat. Please try to increase your water intake to about 64 oz/day. Reduce and eliminate daily alcohol consumption. You can start the topiramate  25 mg daily as prescribed by the ER provider, for migraine prevention.  You can try the Nurtec as needed for acute migraine auras, as prescribed by your PCP. I agree with the referral to ophthalmology; please check on the status of your referral.  Continue to limit your caffeine to 1 serving or less perday, as caffeine can drive headaches.  I agree with pursuing a brain MRI, as ordered by your PCP.  I will order a sleep study to look for signs of obstructive sleep apnea (aka OSA). As explained, the long-term risks and ramifications of untreated moderate to severe obstructive sleep apnea may include (but are not limited to): increased risk for cardiovascular disease, including congestive heart failure, stroke, difficult to control  hypertension, treatment resistant obesity, arrhythmias, especially irregular heartbeat commonly known as A. Fib. (atrial fibrillation); even type 2 diabetes has been linked to untreated OSA. If you have OSA, I will likely offer you treatment with a so-called autoPAP machine.  If you have recurrent or worsening neck pain, talk to your PCP about seeing a spine specialist or pursuing a neck MRI.  We will plan a follow up after testing.

## 2024-10-23 NOTE — Assessment & Plan Note (Signed)
 Intermittent left-sided neck pain, possibly positional. No neurological symptoms. Pain relieved by Toradol . - Continue over-the-counter pain relief as needed.

## 2024-10-23 NOTE — Telephone Encounter (Signed)
 Pharmacy Patient Advocate Encounter   Received notification from [employer] that prior authorization for Rimegepant Sulfate (NURTEC) 75 MG TBDP  is required/requested.   Insurance verification completed.   The patient is insured through [employer].   Per test claim: PA required; PA submitted to above mentioned insurance via Latent Key/confirmation #/EOC [id number] Status is pending

## 2024-10-23 NOTE — Progress Notes (Signed)
 Subjective:    Patient ID: Stephanie Gomez is a 39 y.o. female.  HPI    True Mar, MD, PhD [facility] [address], [address] [address] [place], [place] [address]  I saw patient, Stephanie Gomez, as a referral from the emergency room for evaluation of her headache, concern for ocular migraine.  The patient is unaccompanied today.  [name] is a 39 year old female with an underlying medical history of anxiety, lumbar radiculopathy, right knee pain, PVCs, and overweight state, who reports recurrent visual auras for the past several days, but no recurrent headaches.  She used to have visual auras very rarely and had a handful of episodes over the past few years.  She has had recent increase in these visual symptoms including blurry vision, wavy lines, C-shaped visual defect, typically starting in both visual fields but then transitioning to 1 side or another.  She denies any one-sided weakness or numbness or tingling or droopy face or slurring of speech, no obvious trigger other than recent viral illness with upper respiratory infection and decrease in appetite.  She admits that she does not always hydrate well, estimates that she drinks about 32 ounces of water per day on average.  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  She has a 73-year-old daughter at home and lives with her husband and child.  She has not had alcohol since the end of December but usually does drink a beer per night.  She is a non-smoker.  She limits her caffeine to 1 cup of coffee and usually has about 6 ounces or so. She is adopted and does not know much about her father side of family history. She endorses work-related stress but it is not much more than her usual.  She has seen her optometrist recently and had a scan of her retina and also pressure check and both were benign as far she can tell, she saw Dr. Glendia.  She has been referred to ophthalmology, Dr. Valdemar as she recalls but has not  made an appointment yet, she is encouraged to call their office for an appointment.  She does not typically snore as far she knows.  She does not have much in the way of headaches and has not woken up with a headache.  She has never had a sleep study. She presented to the emergency room on 10/20/2024 with a chief complaint of left eye blurry vision.  She reported an episode that lasted about 25 to 30 minutes that day.  Since she had no headache at the time.  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  She reported some associated neck pain.  I reviewed the emergency room records.  She was treated symptomatically with prochlorperazine , ketorolac , and IV fluids and received potassium.  Laboratory testing showed negative urine pregnancy test, CMP showed potassium below normal at 3.3, otherwise benign.  CBC with differential benign.  INR normal.  Magnesium was normal at 2.3.  She had a CT angiogram of the head and neck with and without contrast on 10/20/2024 and I reviewed the results:  IMPRESSION:   1. No acute intracranial hemorrhage or ischemic change. 2. No evidence of significant stenosis, aneurysmal dilatation, or dissection involving the arteries of the head and neck.   In addition, I personally and independently reviewed images through the PACS system.  Of note, she presented to the emergency room again on 10/22/2024 with a chief complaint of intermittent blurry vision.  She reported a  30-minute episode of blurry vision.  She did not have associated headache.  She reported a viral illness about a week prior.  I reviewed the emergency room records.  She was given a prescription for topiramate  25 mg strength.  She saw her primary care provider through family medicine on 10/22/2024 and I reviewed the office visit note.  An MRI of the brain was ordered but has not been completed yet.  She had not started the topiramate  yet as it was just prescribed.  She was also prescribed Nurtec for as needed use by her  PCP.  She was referred to ophthalmology.  For her neck pain she was advised to use over-the-counter medication as needed.  Toradol  had helped her neck pain.  She has been reluctant to start the Topamax  for fear of side effects.  She is reassured that it is a small dose, in fact the lowest dose it comes in.  She has not trialed the Nurtec yet as it was just prescribed yesterday.  She has had some residual neck pain on the left side but no radiating pain, not debilitating.  Her Past Medical History Is Significant For: Past Medical History:  Diagnosis Date   Anxiety    patient-activated cardiac event monitor 11/2017   Mostly NSR (some brady & tachy) - rates 45-142 bpm.  Rare PVCs (singles - no couplets, runs or bi-trigeminy), No PACs, PAT or arrhythmia such as SVT, A. fib or a flutter.   PONV (postoperative nausea and vomiting)    Sensation of chest tightness 11/29/2017   Wellness examination 08/09/2024    Her Past Surgical History Is Significant For: Past Surgical History:  Procedure Laterality Date   FRACTURE SURGERY     Nose fracture   NASAL FRACTURE SURGERY     TRANSTHORACIC ECHOCARDIOGRAM  11/2017   EF 50 and 55% with no regional Booton motion normality.  Normal valves.  No mitral prolapse.   WISDOM TOOTH EXTRACTION      Her Family History Is Significant For: Family History  Adopted: Yes  Problem Relation Age of Onset   Seizures Mother        She is not aware of any details.  This is her birth mother, and she has limited information   Hyperlipidemia Mother    Hypertension Mother    Skin cancer Maternal Grandfather    Cancer Maternal Grandfather    Cancer Paternal Grandmother    Skin cancer Paternal Grandfather    Migraines Neg Hx     Her Social History Is Significant For: Social History   Socioeconomic History   Marital status: Married    Spouse name: Not on file   Number of children: Not on file   Years of education: Not on file   Highest education level: Associate  degree: occupational, scientist, product/process development, or vocational program  Occupational History   Occupation: VET. TECHNICIAN    Employer: [employer]  Tobacco Use   Smoking status: Never    Passive exposure: Never   Smokeless tobacco: Never  Vaping Use   Vaping status: Never Used  Substance and Sexual Activity   Alcohol use: Yes    Alcohol/week: 2.0 standard drinks of alcohol    Types: 2 Cans of beer per week    Comment: rare   Drug use: No   Sexual activity: Yes    Birth control/protection: Condom    Comment: Not currently using either  Other Topics Concern   Not on file  Social History Narrative  She is married.  Works as a museum/gallery conservator.  Has a 25 year old daughter.   Never smoked.  No alcohol.  No caffeine.   Social Drivers of Health   Tobacco Use: Low Risk (10/23/2024)   Patient History    Smoking Tobacco Use: Never    Smokeless Tobacco Use: Never    Passive Exposure: Never  Financial Resource Strain: Low Risk (08/09/2024)   Overall Financial Resource Strain (CARDIA)    Difficulty of Paying Living Expenses: Not hard at all  Food Insecurity: No Food Insecurity (08/09/2024)   Epic    Worried About Programme Researcher, Broadcasting/film/video in the Last Year: Never true    Ran Out of Food in the Last Year: Never true  Transportation Needs: No Transportation Needs (08/09/2024)   Epic    Lack of Transportation (Medical): No    Lack of Transportation (Non-Medical): No  Physical Activity: Insufficiently Active (08/09/2024)   Exercise Vital Sign    Days of Exercise per Week: 4 days    Minutes of Exercise per Session: 30 min  Stress: No Stress Concern Present (08/09/2024)   [employer] of Occupational Health - Occupational Stress Questionnaire    Feeling of Stress: Only a little  Social Connections: Socially Isolated (08/09/2024)   Social Connection and Isolation Panel    Frequency of Communication with Friends and Family: Once a week    Frequency of Social Gatherings with Friends and Family: Once a  week    Attends Religious Services: Never    Database Administrator or Organizations: No    Attends Engineer, Structural: Not on file    Marital Status: Married  Depression (PHQ2-9): Low Risk (08/09/2024)   Depression (PHQ2-9)    PHQ-2 Score: 0  Alcohol Screen: Low Risk (08/09/2024)   Alcohol Screen    Last Alcohol Screening Score (AUDIT): 4  Housing: Low Risk (08/09/2024)   Epic    Unable to Pay for Housing in the Last Year: No    Number of Times Moved in the Last Year: 0    Homeless in the Last Year: No  Utilities: Not At Risk (08/08/2023)   [employer] Utilities    Threatened with loss of utilities: No  Health Literacy: Adequate Health Literacy (08/08/2023)   B1300 Health Literacy    Frequency of need for help with medical instructions: Never    Her Allergies Are:  Allergies[1]:   Her Current Medications Are:  Outpatient Encounter Medications as of 10/23/2024  Medication Sig   Multiple Vitamin (MULTIVITAMIN) tablet Take 1 tablet by mouth daily.   Rimegepant Sulfate (NURTEC) 75 MG TBDP Take 1 tablet (75 mg total) by mouth daily as needed. (Patient not taking: Reported on 10/23/2024)   topiramate  (TOPAMAX ) 25 MG tablet Take 1 tablet (25 mg total) by mouth daily. Can increase to 50mg  daily after 2 weeks if symptoms persist and you are tolerating medication well. (Patient not taking: Reported on 10/23/2024)   No facility-administered encounter medications on file as of 10/23/2024.  :   Review of Systems:  Out of a complete 14 point review of systems, all are reviewed and negative with the exception of these symptoms as listed below:  Review of Systems  Objective:  Neurological Exam  Physical Exam Physical Examination:   Vitals:   10/23/24 1316  BP: 111/75  Pulse: 78    General Examination: The patient is a very pleasant 39 y.o. female in no acute distress. She appears well-developed and well-nourished and well groomed.  HEENT: Normocephalic, atraumatic, pupils  are equal, round and reactive to light, extraocular tracking is good without limitation to gaze excursion or nystagmus noted. No photophobia.  Funduscopic exam benign.  Corrective eye glasses in place. Hearing is grossly intact.  Face is symmetric with normal facial animation and normal facial sensation to light touch, temperature and vibration sense. Speech is clear without dysarthria. There is no hypophonia. There is no lip, neck/head, jaw or voice tremor. Neck is supple with full range of passive and active motion. There are no carotid bruits on auscultation.  Airway/Oropharynx exam reveals: mild mouth dryness, good dental hygiene and moderate airway crowding, due to tonsillar prominence right more than left, about 2+ bilaterally, slightly wider uvula.  Tongue protrudes centrally and palate elevates symmetrically.  Mallampati class II.  Chest: Clear to auscultation without wheezing, rhonchi or crackles noted.  Heart: S1+S2+0, regular and normal without murmurs, rubs or gallops noted.   Abdomen: Soft, non-tender and non-distended.  Extremities: There is no pitting edema in the distal lower extremities bilaterally.   Skin: Warm and dry without trophic changes noted.   Musculoskeletal: exam reveals no obvious joint deformities.   Neurologically:  Mental status: The patient is awake, alert and oriented in all 4 spheres. Her immediate and remote memory, attention, language skills and fund of knowledge are appropriate. There is no evidence of aphasia, agnosia, apraxia or anomia. Speech is clear with normal prosody and enunciation. Thought process is linear. Mood is normal and affect is normal.  Cranial nerves II - XII are as described above under HEENT exam.  Motor exam: Normal bulk, strength and tone is noted. There is no obvious action or resting tremor.  Fine motor skills and coordination: Intact finger taps, hand movements and rapid alternating patting with both upper extremities, normal foot  taps bilaterally in the lower extremities.  Cerebellar testing: No dysmetria or intention tremor. There is no truncal or gait ataxia.  Finger-to-nose, normal heel-to-shin bilaterally. Reflexes 2-3+ throughout, toes are downgoing bilaterally. Sensory exam: intact to light touch, temperature and vibration sense in the upper and lower extremities.  Romberg negative. Gait, station and balance: She stands easily. No veering to one side is noted. No leaning to one side is noted. Posture is age-appropriate and stance is narrow based. Gait shows normal stride length and normal pace. No problems turning are noted.  Normal tandem walk.  Assessment and Plan:  In summary, ADDYSYN FERN is a very pleasant 39 y.o.-year old female with an underlying medical history of anxiety, lumbar radiculopathy, right knee pain, PVCs, and overweight state, who Presents for evaluation of her recurrent visual symptoms, consistent with visual auras without headaches.  Symptoms started a few days ago, she is currently without acute symptoms.  Neurological exam is nonfocal and reassuring.  I had a long discussion with the patient today and essentially agree with her current workup and plan.  She is referred to ophthalmology.  She has been given prescriptions for migraine prevention and acute migraines.  She is encouraged to try these medications.  We talked about expectations and common side effects.  She is pursuing a brain MRI as well.  The only test I would like to add is a sleep study to help rule out obstructive sleep apnea despite lack of daytime somnolence and snoring.  She does have a moderately crowded airway and it is clinically indicated to get her checked with a sleep study.  I explained this to her as well.  This was an  extended visit of over 60 minutes with copious record review involved, personal review of imaging tests, ER visit review, PCP visit review, comprehensive exam and considerable counseling and coordination of  care. Below is a summary of my recommendations and our discussion points from today's visit, based on chart review, history and examination. They were given these instructions verbally during the visit in detail and also in writing in the MyChart after visit summary (AVS), which they can access electronically. << It was nice to meet you today.   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   Here is what we discussed today and my recommendations for you:   Please remember, common headache and migraine or visual aura triggers are: sleep deprivation, dehydration, overheating, stress, hypoglycemia or skipping meals and blood sugar fluctuations, excessive pain medications or excessive alcohol use or caffeine withdrawal. Some people have food triggers such as aged cheese, orange juice or chocolate, especially dark chocolate, or MSG (monosodium glutamate). Try to avoid these headache triggers as much possible. It may be helpful to keep a headache diary to figure out what makes your headaches worse or brings them on and what alleviates them. Some people report headache onset after exercise but studies have shown that regular exercise may actually prevent headaches from coming. If you have exercise-induced headaches, please make sure that you drink plenty of fluid before and after exercising and that you do not over do it and do not overheat. Please try to increase your water intake to about 64 oz/day. Reduce and eliminate daily alcohol consumption. You can start the topiramate  25 mg daily as prescribed by the ER provider, for migraine prevention.  You can try the Nurtec as needed for acute migraine auras, as prescribed by your PCP. I agree with the referral to ophthalmology; please check on the status of your referral.  Continue to limit your caffeine to 1 serving or less perday, as caffeine can drive headaches.  I agree with pursuing a brain MRI, as ordered by your PCP.  I will order a sleep study  to look for signs of obstructive sleep apnea (aka OSA). As explained, the long-term risks and ramifications of untreated moderate to severe obstructive sleep apnea may include (but are not limited to): increased risk for cardiovascular disease, including congestive heart failure, stroke, difficult to control hypertension, treatment resistant obesity, arrhythmias, especially irregular heartbeat commonly known as A. Fib. (atrial fibrillation); even type 2 diabetes has been linked to untreated OSA. If you have OSA, I will likely offer you treatment with a so-called autoPAP machine.  If you have recurrent or worsening neck pain, talk to your PCP about seeing a spine specialist or pursuing a neck MRI.  We will plan a follow up after testing. >>    I answered all her questions today and she was in agreement with our plan.    [1]  Allergies Allergen Reactions   Penicillins Hives    Has patient had a PCN reaction causing immediate rash, facial/tongue/throat swelling, SOB or lightheadedness with hypotension: Yes  Has patient had a PCN reaction causing severe rash involving mucus membranes or skin necrosis: Yes  Has patient had a PCN reaction that required hospitalization No  Has patient had a PCN reaction occurring within the last 10 years: No  If all of the above answers are NO, then may proceed with Cephalosporin use.   Amoxil [Amoxicillin] Hives    Has patient had a PCN reaction causing immediate rash, facial/tongue/throat swelling, SOB or  lightheadedness with hypotension: Yes Has patient had a PCN reaction causing severe rash involving mucus membranes or skin necrosis: Yes Has patient had a PCN reaction that required hospitalization No Has patient had a PCN reaction occurring within the last 10 years: No If all of the above answers are NO, then may proceed with Cephalosporin use.    Apple Itching   Cherry Itching   Doxycycline  Other (See Comments)    Vision bluriness, light headedness     Peach Flavor [Flavoring Agent] Itching   Pear Itching

## 2024-10-23 NOTE — Assessment & Plan Note (Addendum)
 Intermittent visual disturbances without headache. CT scan normal. Optometrist suggested ocular migraines. Neurology appointment scheduled. MRI recommended. Nurtec prescribed for relief and prevention. Discussed side effects and cost. - Ordered MRI w/ w/o contrast. - Referred to ophthalmology. - Prescribed Nurtec for symptomatic relief and prevention. - Advised to hold off on Topamax  until neurology consultation. - Instructed to seek emergency care if neurological symptoms develop.

## 2024-10-25 NOTE — Progress Notes (Signed)
 " [facility] - Clinic Note  10/29/2024   CHIEF COMPLAINT Patient presents for Retina Evaluation  HISTORY OF PRESENT ILLNESS: Stephanie Gomez is a 39 y.o. female who presents to the clinic today for:  HPI     Retina Evaluation   In both eyes.  I, the attending physician,  performed the HPI with the patient and updated documentation appropriately.        Comments   Pt states since Thursday she has had 4 intermittent VA changes OU, then the vision will clear up on one eye and the other remains blurry. Pt states this happened maybe 5 years ago when she was driving but did not reoccur until now, recently she is noticing it when she is just sitting still. Pt has been using ATS for the past 2 days for irritation/dry eye. Pt states she was noticing geometric pattern like floaters during these visual disturbances but denies any FOL/floaters/pain/distortion. Pt has photo of pupil during an episode where the pupil does not appear to be round/normal. Pt was prescribed topamax  and nurtec at hospital but has not started them.       Last edited by Valdemar Rogue, MD on 11/05/2024  5:04 PM.    Pt states she has had these visual disturbances for many years. Some pupil changes that she brought a picture of. Describes the recent ones as 'c shaped' visual disturbances.  Referring physician: Gayle Saddie FALCON, PA-C [address] [place],  [place] [address]  HISTORICAL INFORMATION:  Selected notes from the MEDICAL RECORD NUMBER Referred by ED for ophthalmology f/u following visual disturbances LEE:  Ocular Hx- PMH-   CURRENT MEDICATIONS: No current outpatient medications on file. (Ophthalmic Drugs)   No current facility-administered medications for this visit. (Ophthalmic Drugs)   Current Outpatient Medications (Other)  Medication Sig   Multiple Vitamin (MULTIVITAMIN) tablet Take 1 tablet by mouth daily.   LORazepam  (ATIVAN ) 0.5 MG tablet Take two tablets by mouth 2 hours  prior to MRI. If still anxious 30 minutes prior to MRI take additional tablet.   Rimegepant Sulfate (NURTEC) 75 MG TBDP Take 1 tablet (75 mg total) by mouth daily as needed. (Patient not taking: Reported on 10/23/2024)   topiramate  (TOPAMAX ) 25 MG tablet Take 1 tablet (25 mg total) by mouth daily. Can increase to 50mg  daily after 2 weeks if symptoms persist and you are tolerating medication well. (Patient not taking: Reported on 10/23/2024)   No current facility-administered medications for this visit. (Other)   REVIEW OF SYSTEMS: ROS   Negative for: Constitutional, Gastrointestinal, Neurological, Skin, Genitourinary, Musculoskeletal, HENT, Endocrine, Cardiovascular, Eyes, Respiratory, Psychiatric, Allergic/Imm, Heme/Lymph Last edited by Elnor Avelina RAMAN, COT on 10/29/2024  2:27 PM.     ALLERGIES Allergies[1] PAST MEDICAL HISTORY Past Medical History:  Diagnosis Date   Anxiety    patient-activated cardiac event monitor 11/2017   Mostly NSR (some brady & tachy) - rates 45-142 bpm.  Rare PVCs (singles - no couplets, runs or bi-trigeminy), No PACs, PAT or arrhythmia such as SVT, A. fib or a flutter.   PONV (postoperative nausea and vomiting)    Sensation of chest tightness 11/29/2017   Wellness examination 08/09/2024   Past Surgical History:  Procedure Laterality Date   FRACTURE SURGERY     Nose fracture   NASAL FRACTURE SURGERY     TRANSTHORACIC ECHOCARDIOGRAM  11/2017   EF 50 and 55% with no regional [place] motion normality.  Normal valves.  No mitral  prolapse.   WISDOM TOOTH EXTRACTION     FAMILY HISTORY Family History  Adopted: Yes  Problem Relation Age of Onset   Seizures Mother        She is not aware of any details.  This is her birth mother, and she has limited information   Hyperlipidemia Mother    Hypertension Mother    Skin cancer Maternal Grandfather    Cancer Maternal Grandfather    Cancer Paternal Grandmother    Skin cancer Paternal Grandfather    Migraines Neg Hx     SOCIAL HISTORY Social History[2]     OPHTHALMIC EXAM:  Base Eye Exam     Visual Acuity (Snellen - Linear)       Right Left   Dist cc 20/20 20/20    Correction: Glasses         Tonometry (Tonopen, 2:21 PM)       Right Left   Pressure 19 18         Pupils       Pupils Dark Light Shape React APD   Right PERRL 4 2 Round Brisk None   Left PERRL 4 2 Round Brisk None         Visual Fields       Left Right    Full Full         Extraocular Movement       Right Left    Full, Ortho Full, Ortho         Neuro/Psych     Oriented x3: Yes         Dilation     Both eyes: 1.0% Mydriacyl, 2.5% Phenylephrine  @ 2:22 PM           Slit Lamp and Fundus Exam     External Exam       Right Left   External Normal Normal         Slit Lamp Exam       Right Left   Lids/Lashes Normal Normal   Conjunctiva/Sclera White and quiet White and quiet   Cornea trace PEE trace PEE   Anterior Chamber Deep and clear Deep and clear   Iris Round and dilated Round and dilated   Lens Clear Clear   Anterior Vitreous Clear Clear         Fundus Exam       Right Left   Disc Pink and Sharp, Compact Pink and Sharp   C/D Ratio 0.1 0.2   Macula Flat, Good foveal reflex, No heme or edema Flat, Good foveal reflex, No heme or edema   Vessels Normal Normal   Periphery Attached, no heme Attached, no heme           Refraction     Wearing Rx       Sphere Cylinder Axis Add   Right [place] +1.00 021 none   Left -1.00 +1.75 151 none    Age: 13+ years   Type: SVL           IMAGING AND PROCEDURES  Imaging and Procedures for 10/29/2024  OCT, Retina - OU - Both Eyes       Right Eye Quality was good. Central Foveal Thickness: 266. Progression has no prior data. Findings include normal foveal contour, no IRF, no SRF.   Left Eye Quality was good. Central Foveal Thickness: 271. Progression has no prior data. Findings include normal foveal contour, no IRF, no  SRF.   Notes *Images captured and stored  on drive  Diagnosis / Impression:  NFP; no IRF/SRF OU  Clinical management:  See below  Abbreviations: NFP - Normal foveal profile. CME - cystoid macular edema. PED - pigment epithelial detachment. IRF - intraretinal fluid. SRF - subretinal fluid. EZ - ellipsoid zone. ERM - epiretinal membrane. ORA - outer retinal atrophy. ORT - outer retinal tubulation. SRHM - subretinal hyper-reflective material. IRHM - intraretinal hyper-reflective material           ASSESSMENT/PLAN:   ICD-10-CM   1. Ocular migraine  G43.109 OCT, Retina - OU - Both Eyes     Ocular migraine - seen @ ED on 1.12.26 for vision changes  - pt reports episodes of objects in vision and pupil changes lasting minutes  - BCVA 20/20 OU  - OCT shows NFP, no IRF, SRF OU  - dilated exam normal -- no findings to explain symptoms  - discussed ocular migraine, prognosis, and tx  - Pt is already established with neuro -- has been set up for CT scan of the brain and sleep study.   - f/u here PRN  Ophthalmic Meds Ordered this visit:  No orders of the defined types were placed in this encounter.    Return if symptoms worsen or fail to improve.  There are no Patient Instructions on file for this visit.  Explained the diagnoses, plan, and follow up with the patient and they expressed understanding.  Patient expressed understanding of the importance of proper follow up care.   This document serves as a record of services personally performed by Redell JUDITHANN Hans, MD, PhD. It was created on their behalf by Paulina Jamse Gay an ophthalmic technician. The creation of this record is the provider's dictation and/or activities during the visit.   Electronically signed by: Alana D Fowler  11/05/24  5:05 PM   This document serves as a record of services personally performed by Redell JUDITHANN Hans, MD, PhD. It was created on their behalf by Wanda GEANNIE Keens, COT an ophthalmic technician. The  creation of this record is the provider's dictation and/or activities during the visit.    Electronically signed by:  Wanda GEANNIE Keens, COT  11/05/24 5:05 PM  This document serves as a record of services personally performed by Redell JUDITHANN Hans, MD, PhD. It was created on their behalf by Almetta Pesa, an ophthalmic technician. The creation of this record is the provider's dictation and/or activities during the visit.    Electronically signed by: Almetta Pesa, OA, 11/05/24  5:05 PM  Redell JUDITHANN Hans, M.D., Ph.D. Diseases & Surgery of the Retina and Vitreous [facility] 10/29/2024  I have reviewed the above documentation for accuracy and completeness, and I agree with the above. Redell JUDITHANN Hans, M.D., Ph.D. 11/05/24 5:08 PM   Abbreviations: M myopia (nearsighted); A astigmatism; H hyperopia (farsighted); P presbyopia; Mrx spectacle prescription;  CTL contact lenses; OD right eye; OS left eye; OU both eyes  XT exotropia; ET esotropia; PEK punctate epithelial keratitis; PEE punctate epithelial erosions; DES dry eye syndrome; MGD meibomian gland dysfunction; ATs artificial tears; PFAT's preservative free artificial tears; NSC nuclear sclerotic cataract; PSC posterior subcapsular cataract; ERM epi-retinal membrane; PVD posterior vitreous detachment; RD retinal detachment; DM diabetes mellitus; DR diabetic retinopathy; NPDR non-proliferative diabetic retinopathy; PDR proliferative diabetic retinopathy; CSME clinically significant macular edema; DME diabetic macular edema; dbh dot blot hemorrhages; CWS cotton wool spot; POAG primary open angle glaucoma; C/D cup-to-disc ratio; HVF humphrey visual field; GVF goldmann visual field;  OCT optical coherence tomography; IOP intraocular pressure; BRVO Branch retinal vein occlusion; CRVO central retinal vein occlusion; CRAO central retinal artery occlusion; BRAO branch retinal artery occlusion; RT retinal tear; SB scleral buckle; PPV  pars plana vitrectomy; VH Vitreous hemorrhage; PRP panretinal laser photocoagulation; IVK intravitreal kenalog; VMT vitreomacular traction; MH Macular hole;  NVD neovascularization of the disc; NVE neovascularization elsewhere; AREDS age related eye disease study; ARMD age related macular degeneration; POAG primary open angle glaucoma; EBMD epithelial/anterior basement membrane dystrophy; ACIOL anterior chamber intraocular lens; IOL intraocular lens; PCIOL posterior chamber intraocular lens; Phaco/IOL phacoemulsification with intraocular lens placement; PRK photorefractive keratectomy; LASIK laser assisted in situ keratomileusis; HTN hypertension; DM diabetes mellitus; COPD chronic obstructive pulmonary disease      [1]  Allergies Allergen Reactions   Penicillins Hives    Has patient had a PCN reaction causing immediate rash, facial/tongue/throat swelling, SOB or lightheadedness with hypotension: Yes  Has patient had a PCN reaction causing severe rash involving mucus membranes or skin necrosis: Yes  Has patient had a PCN reaction that required hospitalization No  Has patient had a PCN reaction occurring within the last 10 years: No  If all of the above answers are NO, then may proceed with Cephalosporin use.   Amoxil [Amoxicillin] Hives    Has patient had a PCN reaction causing immediate rash, facial/tongue/throat swelling, SOB or lightheadedness with hypotension: Yes Has patient had a PCN reaction causing severe rash involving mucus membranes or skin necrosis: Yes Has patient had a PCN reaction that required hospitalization No Has patient had a PCN reaction occurring within the last 10 years: No If all of the above answers are NO, then may proceed with Cephalosporin use.    Apple Itching   Cherry Itching   Doxycycline  Other (See Comments)    Vision bluriness, light headedness    Peach Flavor [Flavoring Agent] Itching   Pear Itching  [2]  Social History Tobacco Use   Smoking  status: Never    Passive exposure: Never   Smokeless tobacco: Never  Vaping Use   Vaping status: Never Used  Substance Use Topics   Alcohol use: Yes    Alcohol/week: 2.0 standard drinks of alcohol    Types: 2 Cans of beer per week    Comment: rare   Drug use: No   "

## 2024-10-29 ENCOUNTER — Ambulatory Visit (INDEPENDENT_AMBULATORY_CARE_PROVIDER_SITE_OTHER): Admitting: Ophthalmology

## 2024-10-29 ENCOUNTER — Encounter (INDEPENDENT_AMBULATORY_CARE_PROVIDER_SITE_OTHER): Payer: Self-pay | Admitting: Ophthalmology

## 2024-10-29 DIAGNOSIS — G43109 Migraine with aura, not intractable, without status migrainosus: Secondary | ICD-10-CM

## 2024-10-29 DIAGNOSIS — H3581 Retinal edema: Secondary | ICD-10-CM

## 2024-10-31 NOTE — Telephone Encounter (Signed)
 Pharmacy Patient Advocate Encounter  Received notification from [employer] that Prior Authorization for Nurtec 75MG  dispersible tablets  has been DENIED.  See denial reason below. No denial letter attached in CMM. Will attach denial letter to Media tab once received.  Denial: Not Medically Necessary DUE TO NO TRIPTAN TRIED AND PT DOES NOT SHOW THAT THEY ARE CONTRAINDICATION TO ALL TRIPTAN DOCUMENTED IN CHART   PA #/Case ID/Reference #: [id number]

## 2024-11-01 ENCOUNTER — Encounter: Payer: Self-pay | Admitting: Family Medicine

## 2024-11-01 ENCOUNTER — Telehealth: Payer: Self-pay | Admitting: Neurology

## 2024-11-01 NOTE — Telephone Encounter (Signed)
"   LVM  NPSG & HST [employer] no auth req ref # [id number]  "

## 2024-11-02 ENCOUNTER — Other Ambulatory Visit: Payer: Self-pay | Admitting: Family Medicine

## 2024-11-02 MED ORDER — LORAZEPAM 0.5 MG PO TABS: ORAL_TABLET | ORAL | 0 refills | Status: AC

## 2024-11-05 ENCOUNTER — Encounter (INDEPENDENT_AMBULATORY_CARE_PROVIDER_SITE_OTHER): Payer: Self-pay | Admitting: Ophthalmology

## 2024-11-06 ENCOUNTER — Ambulatory Visit (HOSPITAL_COMMUNITY)
Admission: RE | Admit: 2024-11-06 | Discharge: 2024-11-06 | Disposition: A | Discharge status: Home / Self Care | Source: Ambulatory Visit | Attending: Family Medicine | Admitting: Family Medicine

## 2024-11-06 DIAGNOSIS — H539 Unspecified visual disturbance: Secondary | ICD-10-CM | POA: Diagnosis not present

## 2024-11-06 MED ORDER — GADOBUTROL 1 MMOL/ML IV SOLN
6.0000 mL | Freq: Once | INTRAVENOUS | Status: AC | PRN
  Administered 2024-11-06: 6 mL via INTRAVENOUS

## 2024-11-06 NOTE — Telephone Encounter (Signed)
 NPSG- [employer] no auth req ref # [id number]    Patient is scheduled at [facility] for 11/07/24 at 9 pm,  Sent mychart

## 2024-11-07 ENCOUNTER — Ambulatory Visit: Admitting: Neurology

## 2024-11-07 ENCOUNTER — Ambulatory Visit: Payer: Self-pay | Admitting: Family Medicine

## 2024-11-07 DIAGNOSIS — G43109 Migraine with aura, not intractable, without status migrainosus: Secondary | ICD-10-CM

## 2024-11-07 DIAGNOSIS — M542 Cervicalgia: Secondary | ICD-10-CM

## 2024-11-07 DIAGNOSIS — G4720 Circadian rhythm sleep disorder, unspecified type: Secondary | ICD-10-CM

## 2024-11-07 DIAGNOSIS — G4733 Obstructive sleep apnea (adult) (pediatric): Secondary | ICD-10-CM | POA: Diagnosis not present

## 2024-11-07 DIAGNOSIS — Z9189 Other specified personal risk factors, not elsewhere classified: Secondary | ICD-10-CM

## 2024-11-07 NOTE — Telephone Encounter (Signed)
 Pt returned call during lunch hour, please advise

## 2024-11-07 NOTE — Progress Notes (Signed)
 Called patient she stated that she has not started the the Topamax , and haven't had symptoms since her visit

## 2024-11-07 NOTE — Progress Notes (Signed)
 Called patient LVM to contact the office if pt calls please advise

## 2024-11-13 ENCOUNTER — Ambulatory Visit: Payer: Self-pay | Admitting: Neurology

## 2024-11-13 NOTE — Procedures (Signed)
 Physician Interpretation: Please see link under Procedure Tab or under Encounters tab for physician report, technical report, as well as O2 titration and/or PAP titration tables (if applicable).

## 2025-02-19 ENCOUNTER — Ambulatory Visit

## 2025-08-05 ENCOUNTER — Other Ambulatory Visit

## 2025-08-12 ENCOUNTER — Encounter
